# Patient Record
Sex: Female | Born: 1960 | Race: White | Hispanic: No | Marital: Married | Smoking: Never smoker
Health system: Southern US, Community
[De-identification: ages and names within clinical notes are randomized; demographics above are authoritative.]

## PROBLEM LIST (undated history)

## (undated) DIAGNOSIS — M1990 Unspecified osteoarthritis, unspecified site: Secondary | ICD-10-CM

## (undated) DIAGNOSIS — J45909 Unspecified asthma, uncomplicated: Secondary | ICD-10-CM

## (undated) DIAGNOSIS — I10 Essential (primary) hypertension: Secondary | ICD-10-CM

## (undated) HISTORY — DX: Unspecified asthma, uncomplicated: J45.909

## (undated) HISTORY — DX: Unspecified osteoarthritis, unspecified site: M19.90

## (undated) HISTORY — DX: Essential (primary) hypertension: I10

---

## 2015-05-06 ENCOUNTER — Telehealth: Payer: Self-pay

## 2015-05-06 NOTE — Telephone Encounter (Signed)
Sent montelukast 10mg  with 5 ref[facility]  [phone]

## 2015-05-09 ENCOUNTER — Other Ambulatory Visit: Payer: Self-pay | Admitting: *Deleted

## 2015-05-09 MED ORDER — MONTELUKAST SODIUM 10 MG PO TABS: 10.0000 mg | ORAL_TABLET | Freq: Every day | ORAL | Status: DC

## 2015-05-20 ENCOUNTER — Telehealth: Payer: Self-pay | Admitting: Internal Medicine

## 2015-05-20 ENCOUNTER — Other Ambulatory Visit: Payer: Self-pay | Admitting: Internal Medicine

## 2015-05-20 ENCOUNTER — Encounter: Payer: Self-pay | Admitting: Internal Medicine

## 2015-05-20 ENCOUNTER — Ambulatory Visit (INDEPENDENT_AMBULATORY_CARE_PROVIDER_SITE_OTHER): Payer: BLUE CROSS/BLUE SHIELD | Admitting: Internal Medicine

## 2015-05-20 VITALS — BP 124/78 | HR 88 | Temp 98.5°F | Resp 16 | Ht 63.58 in | Wt 192.7 lb

## 2015-05-20 DIAGNOSIS — J301 Allergic rhinitis due to pollen: Secondary | ICD-10-CM | POA: Insufficient documentation

## 2015-05-20 DIAGNOSIS — J4541 Moderate persistent asthma with (acute) exacerbation: Secondary | ICD-10-CM | POA: Diagnosis not present

## 2015-05-20 NOTE — Progress Notes (Signed)
05/20/2015  Bridget Bell [birth year] [id number]  Referring provider: No referring provider defined for this encounter.  Chief Complaint: Cough; Wheezing; Sore Throat; and Nasal Congestion   Bridget Bell is a 54 y.o. female who is being seen today for a sick visit.   HPI Comments: Asthma: For the past week patient has had a nonproductive cough requiring her to use her albuterol several times a day.  Allergic rhinitis on immunotherapy: Start 07/25/11, maintenance reached 01/23/2014. She is on biweekly injections. Symptoms were stable until recently as above. She has had a sore throat along with some drainage. No fever.    ROS: Per HPI unless specifically indicated below Review of Systems   Drug Allergies:  No Known Allergies  Medications:  Current outpatient prescriptions:  .  acetaminophen (TYLENOL) 650 MG CR tablet, Take 1,300 mg by mouth daily., Disp: , Rfl:  .  Albuterol Sulfate (PROVENTIL HFA IN), Inhale 2 puffs into the lungs every 4 (four) hours as needed., Disp: , Rfl:  .  amLODipine (NORVASC) 10 MG tablet, , Disp: , Rfl:  .  Bioflavonoid Products (ESTER C PO), Take 1,000 mg by mouth daily., Disp: , Rfl:  .  Calcium Carb-Cholecalciferol (CALCIUM+D3) 600-800 MG-UNIT TABS, Take by mouth daily., Disp: , Rfl:  .  Chromium-Cinnamon (CINNAMON PLUS CHROMIUM PO), Take 1,000 mg by mouth 2 (two) times daily., Disp: , Rfl:  .  cyclobenzaprine (FLEXERIL) 10 MG tablet, , Disp: , Rfl:  .  Digestive Enzymes (PAPAYA ENZYME PO), Take 66 mg by mouth daily., Disp: , Rfl:  .  Docosahexaenoic Acid (DHA PO), Take 400 mg by mouth daily., Disp: , Rfl:  .  ECHINACEA PO, Take 760 mg by mouth daily., Disp: , Rfl:  .  EPINEPHrine (EPIPEN 2-PAK) 0.3 mg/0.3 mL IJ SOAJ injection, Inject 0.3 mg into the muscle as needed., Disp: , Rfl:  .  estradiol (ESTRACE) 0.5 MG tablet, Take 0.5 mg by mouth daily. , Disp: , Rfl:  .  fexofenadine (ALLEGRA) 180 MG tablet, Take 180 mg by mouth daily., Disp: , Rfl:  .   Fexofenadine HCl (MUCINEX ALLERGY PO), Take 400 mg by mouth 2 (two) times daily. Sometimes patient takes it 3 times daily., Disp: , Rfl:  .  gabapentin (NEURONTIN) 300 MG capsule, , Disp: , Rfl:  .  GARCINIA CAMBOGIA-CHROMIUM PO, Take 3,000 mg by mouth daily., Disp: , Rfl:  .  Ginger, Zingiber officinalis, (GINGER ROOT) 550 MG CAPS, Take 550 mg by mouth daily., Disp: , Rfl:  .  hydrochlorothiazide (HYDRODIURIL) 25 MG tablet, , Disp: , Rfl:  .  KLOR-CON M10 10 MEQ tablet, Take by mouth., Disp: , Rfl:  .  KOREAN GINSENG PO, Take 500 mg by mouth daily., Disp: , Rfl:  .  Krill Oil (MAXIMUM RED KRILL PO), Take 1,000 mg by mouth daily., Disp: , Rfl:  .  losartan-hydrochlorothiazide (HYZAAR) 100-25 MG tablet, , Disp: , Rfl:  .  Magnesium 400 MG CAPS, Take by mouth daily., Disp: , Rfl:  .  meloxicam (MOBIC) 15 MG tablet, , Disp: , Rfl:  .  Misc Natural Products (GLUCOSAMINE CHONDROITIN MSM PO), Take 1,500 mg by mouth daily., Disp: , Rfl:  .  mometasone (NASONEX) 50 MCG/ACT nasal spray, , Disp: , Rfl:  .  montelukast (SINGULAIR) 10 MG tablet, Take 1 tablet (10 mg total) by mouth at bedtime., Disp: 30 tablet, Rfl: 5 .  Multiple Vitamins-Minerals (CENTRUM SILVER ULTRA WOMENS PO), Take by mouth daily., Disp: , Rfl:  .  NONFORMULARY  OR COMPOUNDED ITEM, , Disp: , Rfl:  .  omega-3 fish oil (MAXEPA) 1000 MG CAPS capsule, Take 2 capsules by mouth daily., Disp: , Rfl:  .  ranitidine (ZANTAC) 150 MG tablet, Take 150 mg by mouth daily., Disp: , Rfl:  .  SYMBICORT 160-4.5 MCG/ACT inhaler, Inhale 2 puffs into the lungs 2 (two) times daily. , Disp: , Rfl:  .  albuterol (PROVENTIL) (2.5 MG/3ML) 0.083% nebulizer solution, USE ONE VIAL IN NEBULIZER EVERY 4 TO 6 HOURS AS NEEDED FOR COUGH OR WHEEZE, Disp: 75 mL, Rfl: 1  Physical Exam: BP 124/78 mmHg  Pulse 88  Temp(Src) 98.5 F (36.9 C) (Oral)  Resp 16  Ht 5' 3.58" (1.615 m)  Wt 192 lb 10.9 oz (87.4 kg)  BMI 33.51 kg/m2  Physical Exam  Constitutional: She appears  well-developed and well-nourished. No distress.  HENT:  Right Ear: External ear normal.  Left Ear: External ear normal.  Nose: Nose normal.  Mouth/Throat: Oropharynx is clear and moist.  Eyes: Conjunctivae are normal. Right eye exhibits no discharge. Left eye exhibits no discharge.  Cardiovascular: Normal rate, regular rhythm and normal heart sounds.   No murmur heard. Pulmonary/Chest: Effort normal and breath sounds normal. No respiratory distress. She has no wheezes. She has no rales.  Abdominal: Soft. Bowel sounds are normal.  Musculoskeletal: She exhibits no edema.  Lymphadenopathy:    She has no cervical adenopathy.  Neurological: She is alert.  Skin: No rash noted.  Vitals reviewed.   Diagnostics:   Spirometry: FEV1 101 %, FEV1/FVC  77 %   Spirometry is in the normal range.  Assessment and Plan:  Asthma  Persistent, currently not well controlled due to viral syndrome versus allergies. Prednisone 10 mg tablets. Take 1 tablet twice a day for 4 days, then 1 tablet on day #5. Continue Symbicort, Singulair, as needed albuterol. Hold flu shot and allergy shots until back to baseline.    Return in about 4 weeks (around 06/17/2015).  Thank you for the opportunity to care for this patient.  Please do not hesitate to contact me with questions.  [facility] [place] [address] [place], [place] [id number] [phone]

## 2015-05-20 NOTE — Telephone Encounter (Signed)
Faxed in prescription for albuterol nebulizer. Informed patient.

## 2015-05-20 NOTE — Assessment & Plan Note (Signed)
   Persistent, currently not well controlled due to viral syndrome versus allergies. Prednisone 10 mg tablets. Take 1 tablet twice a day for 4 days, then 1 tablet on day #5. Continue Symbicort, Singulair, as needed albuterol. Hold flu shot and allergy shots until back to baseline.

## 2015-05-20 NOTE — Patient Instructions (Signed)
Asthma  Persistent, currently not well controlled due to viral syndrome versus allergies. Prednisone 10 mg tablets. Take 1 tablet twice a day for 4 days, then 1 tablet on day #5. Continue Symbicort, Singulair, as needed albuterol. Hold flu shot and allergy shots until back to baseline.

## 2015-05-20 NOTE — Telephone Encounter (Signed)
She forgot that she needed her Albuterol nebulizer refilled. [facility] uses [facility] [place]

## 2015-05-24 ENCOUNTER — Ambulatory Visit: Payer: BLUE CROSS/BLUE SHIELD | Admitting: Pediatrics

## 2015-06-22 ENCOUNTER — Ambulatory Visit: Payer: BLUE CROSS/BLUE SHIELD | Admitting: Internal Medicine

## 2015-07-08 DIAGNOSIS — J301 Allergic rhinitis due to pollen: Secondary | ICD-10-CM | POA: Diagnosis not present

## 2015-07-12 ENCOUNTER — Ambulatory Visit: Payer: BLUE CROSS/BLUE SHIELD | Admitting: *Deleted

## 2015-07-12 ENCOUNTER — Ambulatory Visit (INDEPENDENT_AMBULATORY_CARE_PROVIDER_SITE_OTHER): Payer: BLUE CROSS/BLUE SHIELD | Admitting: *Deleted

## 2015-07-12 DIAGNOSIS — J309 Allergic rhinitis, unspecified: Secondary | ICD-10-CM | POA: Diagnosis not present

## 2015-07-12 NOTE — Progress Notes (Signed)
Immunotherapy   Patient Details  Name: Bridget Bell MRN: [id number] Date of Birth: Jan 28, 1961  07/12/2015  Bridget Bell here to pick up  Red vial 1:100 Grass-Tree Following schedule: C  Frequency: Build up weekly, at [facility]0.5cc every 2 weeks. Epi-Pen:Prescription for Epi-Pen given  Consent signed and patient instructions given. 0.1cc given today.    Chales SalmonLogan Douglas 07/12/2015, 3:56 PM

## 2015-08-01 ENCOUNTER — Ambulatory Visit: Payer: Self-pay | Admitting: Internal Medicine

## 2015-08-23 ENCOUNTER — Encounter: Payer: Self-pay | Admitting: Pediatrics

## 2015-08-23 ENCOUNTER — Ambulatory Visit (INDEPENDENT_AMBULATORY_CARE_PROVIDER_SITE_OTHER): Payer: BLUE CROSS/BLUE SHIELD | Admitting: Pediatrics

## 2015-08-23 VITALS — BP 120/80 | HR 100 | Temp 98.0°F | Resp 16

## 2015-08-23 DIAGNOSIS — J4551 Severe persistent asthma with (acute) exacerbation: Secondary | ICD-10-CM | POA: Insufficient documentation

## 2015-08-23 DIAGNOSIS — K219 Gastro-esophageal reflux disease without esophagitis: Secondary | ICD-10-CM | POA: Diagnosis not present

## 2015-08-23 DIAGNOSIS — J301 Allergic rhinitis due to pollen: Secondary | ICD-10-CM | POA: Diagnosis not present

## 2015-08-23 DIAGNOSIS — J4541 Moderate persistent asthma with (acute) exacerbation: Secondary | ICD-10-CM

## 2015-08-23 MED ORDER — SYMBICORT 160-4.5 MCG/ACT IN AERO: 2.0000 | INHALATION_SPRAY | Freq: Two times a day (BID) | RESPIRATORY_TRACT | Status: DC

## 2015-08-23 MED ORDER — ALBUTEROL SULFATE HFA 108 (90 BASE) MCG/ACT IN AERS: 2.0000 | INHALATION_SPRAY | RESPIRATORY_TRACT | Status: DC | PRN

## 2015-08-23 NOTE — Progress Notes (Signed)
  [address] [place] [address] Dept: [phone]  FOLLOW UP NOTE  Patient ID: Bridget Bell, female    DOB: [birth year]  Age: 55 y.o. MRN: [id number] Date of Office Visit: 08/23/2015  Assessment Chief Complaint: Cough  HPI Bridget Bell presents for evaluation of coughing and wheezing. She had a cold 2 weeks ago and treated it  with nasal saline irrigations followed by Nasonex. She has developed severe coughing spells over the past 3 days. She is on allergy injections every 2 weeks to grass and tree pollens  Current medications are Symbicort 160-4.5 to take 2 puffs twice a day, albuterol 0.083% one unit dose every 4 hours if needed, Proventil 2 puffs every 4 hours if needed, montelukast 10 mg once a day, fexofenadine 180 mg once a day if needed, and nasal saline irrigations followed by Nasonex 1 spray per nostril twice a day, allergy injections every 2 weeks. Her other medications are outlined in the chart..   Drug Allergies:  No Known Allergies  Physical Exam: BP 120/80 mmHg  Pulse 100  Temp(Src) 98 F (36.7 C) (Oral)  Resp 16   Physical Exam  Constitutional: She is oriented to person, place, and time. She appears well-developed and well-nourished.  HENT:  Eyes normal. Ears normal. Nose mild swelling of his turbinates. Pharynx normal.  Neck: Neck supple.  Cardiovascular:  S1 and S2 normal no murmurs  Pulmonary/Chest:  Clear to percussion and auscultation  Lymphadenopathy:    She has no cervical adenopathy.  Neurological: She is alert and oriented to person, place, and time.  Psychiatric: She has a normal mood and affect. Her behavior is normal. Judgment and thought content normal.  Vitals reviewed.   Diagnostics:  FVC 3.35 L FEV1 2.78 L. Predicted FVC 3.40 L predicted FEV1  2.65 liters-spirometry in the normal range  Assessment and Plan: 1. Moderate persistent asthma, with acute exacerbation   2. Allergic rhinitis due to pollen   3. Gastroesophageal reflux  disease without esophagitis     Meds ordered this encounter  Medications  . SYMBICORT 160-4.5 MCG/ACT inhaler    Sig: Inhale 2 puffs into the lungs 2 (two) times daily.    Dispense:  1 Inhaler    Refill:  5  . albuterol (PROVENTIL HFA) 108 (90 Base) MCG/ACT inhaler    Sig: Inhale 2 puffs into the lungs every 4 (four) hours as needed.    Dispense:  1 Inhaler    Refill:  1    Patient Instructions  Continue on your current medications Add prednisone 20 mg twice a day for 3 days, 20 mg on day 4, 10 mg on day  5 Call me if you're not doing well on this treatment plan    Return in about 6 months (around 02/20/2016).    Thank you for the opportunity to care for this patient.  Please do not hesitate to contact me with questions.  Tonette Bihari, M.D.  [facility] [address] [place], [place] [address] [phone]

## 2015-08-23 NOTE — Patient Instructions (Addendum)
Continue on your current medications Add prednisone 20 mg twice a day for 3 days, 20 mg on day 4, 10 mg on day  5 Call me if you're not doing well on this treatment plan

## 2015-09-02 ENCOUNTER — Encounter: Payer: Self-pay | Admitting: Internal Medicine

## 2015-09-02 ENCOUNTER — Ambulatory Visit (INDEPENDENT_AMBULATORY_CARE_PROVIDER_SITE_OTHER): Payer: BLUE CROSS/BLUE SHIELD | Admitting: Internal Medicine

## 2015-09-02 VITALS — BP 134/86 | HR 96 | Temp 98.8°F | Resp 16

## 2015-09-02 DIAGNOSIS — J301 Allergic rhinitis due to pollen: Secondary | ICD-10-CM

## 2015-09-02 DIAGNOSIS — J4541 Moderate persistent asthma with (acute) exacerbation: Secondary | ICD-10-CM

## 2015-09-02 LAB — PULMONARY FUNCTION TEST

## 2015-09-02 MED ORDER — ALBUTEROL SULFATE (2.5 MG/3ML) 0.083% IN NEBU: 2.5000 mg | INHALATION_SOLUTION | RESPIRATORY_TRACT | Status: DC | PRN

## 2015-09-02 MED ORDER — AZITHROMYCIN 250 MG PO TABS: ORAL_TABLET | ORAL | Status: DC

## 2015-09-02 MED ORDER — ALBUTEROL SULFATE (2.5 MG/3ML) 0.083% IN NEBU: 2.5000 mg | INHALATION_SOLUTION | Freq: Four times a day (QID) | RESPIRATORY_TRACT | Status: DC | PRN

## 2015-09-02 NOTE — Patient Instructions (Signed)
Allergic rhinitis due to pollen  On immunotherapy, currently not well controlled due to sinusitis  Continue Singulair 10 mg daily, Allegra 180 mg daily (fexofenadine), Mucinex, Nasonex 2 sprays each nostril daily area   Perform nasal saline lavage prior to nasal sprays  Given a Z-Pak  Has EpiPen and action plan  Moderate persistent asthma  Currently not well controlled due to infection  Antibiotics as above  Continue Symbicort 160 g 2 puffs twice a day, Singulair 10 mg daily, as needed albuterol  Will evaluate for Nucala versus Xolair  Check immune screen. Will check CBC with differential, CMP, ESR, quantitative antibody levels, tetanus/diphtheria/pneumococcal titers, CH 50, zone 3 allergen panel. She has never had a Pneumovax.

## 2015-09-02 NOTE — Assessment & Plan Note (Signed)
   Currently not well controlled due to infection  Antibiotics as above  Continue Symbicort 160 g 2 puffs twice a day, Singulair 10 mg daily, as needed albuterol  Will evaluate for Nucala versus Xolair  Check immune screen. Will check CBC with differential, CMP, ESR, quantitative antibody levels, tetanus/diphtheria/pneumococcal titers, CH 50, zone 3 allergen panel. She has never had a Pneumovax.

## 2015-09-02 NOTE — Assessment & Plan Note (Signed)
   On immunotherapy, currently not well controlled due to sinusitis  Continue Singulair 10 mg daily, Allegra 180 mg daily (fexofenadine), Mucinex, Nasonex 2 sprays each nostril daily area   Perform nasal saline lavage prior to nasal sprays  Given a Z-Pak  Has EpiPen and action plan

## 2015-09-02 NOTE — Progress Notes (Signed)
History of Present Illness: Bridget Bell is a 55 y.o. female presenting for a sick visit  HPI Comments: Asthma: At patient's last visit, symptoms were well controlled so she was given a prednisone burst and she had transient improvement in her symptoms. For the past 3 weeks, she has had productive cough, nasal drainage of green mucus, associated coughing and wheezing. She has increased use of her rescue inhaler but has not had improvement in her symptoms.  Allergic rhinitis on immunotherapy for grass and tree pollen: Start 07/25/11, maintenance reached 01/23/2014. She is on biweekly injections. Symptoms were stable until recently as above.    Assessment and Plan: Allergic rhinitis due to pollen  On immunotherapy, currently not well controlled due to sinusitis  Continue Singulair 10 mg daily, Allegra 180 mg daily (fexofenadine), Mucinex, Nasonex 2 sprays each nostril daily area   Perform nasal saline lavage prior to nasal sprays  Given a Z-Pak  Has EpiPen and action plan  Moderate persistent asthma  Currently not well controlled due to infection  Antibiotics as above  Continue Symbicort 160 g 2 puffs twice a day, Singulair 10 mg daily, as needed albuterol  Will evaluate for Nucala versus Xolair  Check immune screen. Will check CBC with differential, CMP, ESR, quantitative antibody levels, tetanus/diphtheria/pneumococcal titers, CH 50, zone 3 allergen panel. She has never had a Pneumovax.    Return in about 3 months (around 11/30/2015).  Medications ordered this encounter:  Meds ordered this encounter  Medications  . azithromycin (ZITHROMAX) 250 MG tablet    Sig: Take 2 tablets by mouth today then 1 tablet day 2 thru 5    Dispense:  6 each    Refill:  0    For in  . albuterol (PROVENTIL) (2.5 MG/3ML) 0.083% nebulizer solution    Sig: Take 3 mLs (2.5 mg total) by nebulization every 6 (six) hours as needed for wheezing or shortness of breath.    Dispense:  75 mL    Refill:   12  . azithromycin (ZITHROMAX) 250 MG tablet    Sig: Take 2 tabs by mouth today, then take 1 tab day 2 thru 5 for infection    Dispense:  6 each    Refill:  0  . albuterol (PROVENTIL) (2.5 MG/3ML) 0.083% nebulizer solution    Sig: Take 3 mLs (2.5 mg total) by nebulization every 4 (four) hours as needed for wheezing or shortness of breath.    Dispense:  75 mL    Refill:  1    Diagnostics: Spirometry: FEV1 2.89L or 109%, FEV1/FVC  87%.  This is a normal study  Physical Exam: BP 134/86 mmHg  Pulse 96  Temp(Src) 98.8 F (37.1 C) (Oral)  Resp 16   Physical Exam  Constitutional: She appears well-developed and well-nourished. No distress.  HENT:  Right Ear: External ear normal.  Left Ear: External ear normal.  Nose: Nose normal.  Mouth/Throat: Oropharynx is clear and moist.  Eyes: Conjunctivae are normal. Right eye exhibits no discharge. Left eye exhibits no discharge.  Cardiovascular: Normal rate, regular rhythm and normal heart sounds.   No murmur heard. Pulmonary/Chest: Effort normal and breath sounds normal. No respiratory distress. She has no wheezes. She has no rales.  Abdominal: Soft. Bowel sounds are normal.  Musculoskeletal: She exhibits no edema.  Lymphadenopathy:    She has no cervical adenopathy.  Neurological: She is alert.  Skin: No rash noted.  Vitals reviewed.   Medications: Current outpatient prescriptions:  .  acetaminophen (TYLENOL)  650 MG CR tablet, Take 1,300 mg by mouth daily., Disp: , Rfl:  .  albuterol (PROVENTIL HFA) 108 (90 Base) MCG/ACT inhaler, Inhale 2 puffs into the lungs every 4 (four) hours as needed., Disp: 1 Inhaler, Rfl: 1 .  albuterol (PROVENTIL) (2.5 MG/3ML) 0.083% nebulizer solution, USE ONE VIAL IN NEBULIZER EVERY 4 TO 6 HOURS AS NEEDED FOR COUGH OR WHEEZE, Disp: 75 mL, Rfl: 1 .  amLODipine (NORVASC) 10 MG tablet, , Disp: , Rfl:  .  Bioflavonoid Products (ESTER C PO), Take 1,000 mg by mouth daily., Disp: , Rfl:  .  Calcium  Carb-Cholecalciferol (CALCIUM+D3) 600-800 MG-UNIT TABS, Take by mouth daily., Disp: , Rfl:  .  Chromium-Cinnamon (CINNAMON PLUS CHROMIUM PO), Take 1,000 mg by mouth 2 (two) times daily., Disp: , Rfl:  .  cyclobenzaprine (FLEXERIL) 10 MG tablet, , Disp: , Rfl:  .  Digestive Enzymes (PAPAYA ENZYME PO), Take 66 mg by mouth daily., Disp: , Rfl:  .  Docosahexaenoic Acid (DHA PO), Take 400 mg by mouth daily., Disp: , Rfl:  .  ECHINACEA PO, Take 760 mg by mouth daily., Disp: , Rfl:  .  EPINEPHrine (EPIPEN 2-PAK) 0.3 mg/0.3 mL IJ SOAJ injection, Inject 0.3 mg into the muscle as needed., Disp: , Rfl:  .  estradiol (ESTRACE) 0.5 MG tablet, Take 0.5 mg by mouth daily. , Disp: , Rfl:  .  fexofenadine (ALLEGRA) 180 MG tablet, Take 180 mg by mouth daily., Disp: , Rfl:  .  Fexofenadine HCl (MUCINEX ALLERGY PO), Take 400 mg by mouth 2 (two) times daily. Sometimes patient takes it 3 times daily., Disp: , Rfl:  .  gabapentin (NEURONTIN) 300 MG capsule, , Disp: , Rfl:  .  Ginger, Zingiber officinalis, (GINGER ROOT) 550 MG CAPS, Take 550 mg by mouth daily., Disp: , Rfl:  .  hydrochlorothiazide (HYDRODIURIL) 25 MG tablet, , Disp: , Rfl:  .  KLOR-CON M10 10 MEQ tablet, Take by mouth., Disp: , Rfl:  .  KOREAN GINSENG PO, Take 500 mg by mouth daily. Reported on 09/02/2015, Disp: , Rfl:  .  Krill Oil (MAXIMUM RED KRILL PO), Take 1,000 mg by mouth daily., Disp: , Rfl:  .  losartan-hydrochlorothiazide (HYZAAR) 100-25 MG tablet, , Disp: , Rfl:  .  Magnesium 400 MG CAPS, Take by mouth daily., Disp: , Rfl:  .  meloxicam (MOBIC) 15 MG tablet, , Disp: , Rfl:  .  Misc Natural Products (GLUCOSAMINE CHONDROITIN MSM PO), Take 1,500 mg by mouth daily., Disp: , Rfl:  .  mometasone (NASONEX) 50 MCG/ACT nasal spray, , Disp: , Rfl:  .  montelukast (SINGULAIR) 10 MG tablet, Take 1 tablet (10 mg total) by mouth at bedtime., Disp: 30 tablet, Rfl: 5 .  Multiple Vitamins-Minerals (CENTRUM SILVER ULTRA WOMENS PO), Take by mouth daily., Disp:  , Rfl:  .  NONFORMULARY OR COMPOUNDED ITEM, , Disp: , Rfl:  .  omega-3 fish oil (MAXEPA) 1000 MG CAPS capsule, Take 2 capsules by mouth daily., Disp: , Rfl:  .  ranitidine (ZANTAC) 150 MG tablet, Take 150 mg by mouth daily., Disp: , Rfl:  .  SYMBICORT 160-4.5 MCG/ACT inhaler, Inhale 2 puffs into the lungs 2 (two) times daily., Disp: 1 Inhaler, Rfl: 5 .  albuterol (PROVENTIL) (2.5 MG/3ML) 0.083% nebulizer solution, Take 3 mLs (2.5 mg total) by nebulization every 6 (six) hours as needed for wheezing or shortness of breath., Disp: 75 mL, Rfl: 12 .  albuterol (PROVENTIL) (2.5 MG/3ML) 0.083% nebulizer solution, Take 3 mLs (  2.5 mg total) by nebulization every 4 (four) hours as needed for wheezing or shortness of breath., Disp: 75 mL, Rfl: 1 .  azithromycin (ZITHROMAX) 250 MG tablet, Take 2 tablets by mouth today then 1 tablet day 2 thru 5, Disp: 6 each, Rfl: 0 .  azithromycin (ZITHROMAX) 250 MG tablet, Take 2 tabs by mouth today, then take 1 tab day 2 thru 5 for infection, Disp: 6 each, Rfl: 0 .  GARCINIA CAMBOGIA-CHROMIUM PO, Take 3,000 mg by mouth daily. Reported on 09/02/2015, Disp: , Rfl:   Drug Allergies:  No Known Allergies  ROS: Per HPI unless specifically indicated below Review of Systems  Thank you for the opportunity to care for this patient.  Please do not hesitate to contact me with questions.

## 2015-09-05 ENCOUNTER — Encounter: Payer: Self-pay | Admitting: Internal Medicine

## 2015-09-07 LAB — CBC WITH DIFFERENTIAL/PLATELET

## 2015-09-08 ENCOUNTER — Encounter: Payer: Self-pay | Admitting: *Deleted

## 2015-09-08 LAB — CBC WITH DIFFERENTIAL/PLATELET

## 2015-09-12 ENCOUNTER — Telehealth: Payer: Self-pay | Admitting: *Deleted

## 2015-09-12 NOTE — Telephone Encounter (Signed)
Called patient and explained Xolair submission, process, copay and mailed information to patient

## 2015-09-12 NOTE — Telephone Encounter (Signed)
-----   Message from Clifton James, CMA sent at 09/12/2015  2:38 PM EST ----- Regarding: Phillis Haggis,   Please call patient with Xolair information and please submit.   Thank you

## 2015-09-14 LAB — COMPREHENSIVE METABOLIC PANEL
ALT: 24 IU/L (ref 0–32)
AST: 18 IU/L (ref 0–40)
Albumin/Globulin Ratio: 2 (ref 1.1–2.5)
Albumin: 4.3 g/dL (ref 3.5–5.5)
Alkaline Phosphatase: 65 IU/L (ref 39–117)
BUN/Creatinine Ratio: 17 (ref 9–23)
BUN: 15 mg/dL (ref 6–24)
Bilirubin Total: <0.2 mg/dL (ref 0.0–1.2)
CALCIUM: 9.6 mg/dL (ref 8.7–10.2)
CO2: 20 mmol/L (ref 18–29)
Chloride: 99 mmol/L (ref 96–106)
Creatinine, Ser: 0.89 mg/dL (ref 0.57–1.00)
GFR, EST AFRICAN AMERICAN: 83 mL/min/{1.73_m2} (ref >59)
GFR, EST NON AFRICAN AMERICAN: 72 mL/min/{1.73_m2} (ref >59)
GLUCOSE: 114 mg/dL — AB (ref 65–99)
Globulin, Total: 2.2 g/dL (ref 1.5–4.5)
Potassium: 3.8 mmol/L (ref 3.5–5.2)
Sodium: 141 mmol/L (ref 134–144)
TOTAL PROTEIN: 6.5 g/dL (ref 6.0–8.5)

## 2015-09-14 LAB — COMPLEMENT, TOTAL: Compl, Total (CH50): >60 U/mL — ABNORMAL HIGH (ref 42–60)

## 2015-09-14 LAB — ALLERGENS, ZONE 3
Alternaria Alternata IgE: <0.1 kU/L
Aspergillus Fumigatus IgE: <0.1 kU/L
Cat Dander IgE: 25.3 kU/L — AB
Cladosporium Herbarum IgE: <0.1 kU/L
D001-IGE D PTERONYSSINUS: 0.2 kU/L — AB
D002-IGE D FARINAE: 0.14 kU/L — AB
Dog Dander IgE: 3.97 kU/L — AB
Elm, American IgE: <0.1 kU/L
Hickory, White IgE: <0.1 kU/L
Johnson Grass IgE: 0.17 kU/L — AB
Kentucky Bluegrass IgE: <0.1 kU/L
Penicillium Chrysogen IgE: <0.1 kU/L
Plantain, English IgE: <0.1 kU/L
White Mulberry IgE: <0.1 kU/L

## 2015-09-14 LAB — DIPHTHERIA ANTITOXOID AB: DIPHTHERIA AB: 1.72 [IU]/mL (ref <0.10)

## 2015-09-14 LAB — IGG, IGA, IGM
IGA/IMMUNOGLOBULIN A, SERUM: 268 mg/dL (ref 87–352)
IGG (IMMUNOGLOBIN G), SERUM: 809 mg/dL (ref 700–1600)
IGM (IMMUNOGLOBULIN M), SRM: 116 mg/dL (ref 26–217)

## 2015-09-14 LAB — PNEUMOCOCCAL IM (14 SEROTYPE)
PNEUMO AB TYPE 3: 1.4 ug/mL (ref >1.3)
PNEUMO AB TYPE 4: 0.5 ug/mL — AB (ref >1.3)
PNEUMO AB TYPE 68 (9V): 1.4 ug/mL (ref >1.3)
PNEUMO AB TYPE 9 (9N): 1.3 ug/mL — AB (ref >1.3)
Pneumo Ab Type 1*: 0.5 ug/mL — ABNORMAL LOW (ref >1.3)
Pneumo Ab Type 14*: 7.9 ug/mL (ref >1.3)
Pneumo Ab Type 19 (19F)*: 1 ug/mL — ABNORMAL LOW (ref >1.3)
Pneumo Ab Type 23 (23F)*: 1.2 ug/mL — ABNORMAL LOW (ref >1.3)
Pneumo Ab Type 26 (6B)*: 1 ug/mL — ABNORMAL LOW (ref >1.3)
Pneumo Ab Type 56 (18C)*: 0.5 ug/mL — ABNORMAL LOW (ref >1.3)
Pneumo Ab Type 57 (19A)*: 4.6 ug/mL (ref >1.3)

## 2015-09-14 LAB — IGE: IgE (Immunoglobulin E), Serum: 522 IU/mL — ABNORMAL HIGH (ref 0–100)

## 2015-09-14 LAB — TETANUS ANTIBODY, IGG: Tetanus Ab, IgG: 3.27 IU/mL (ref <0.10)

## 2015-09-14 LAB — SEDIMENTATION RATE: Sed Rate: 5 mm/hr (ref 0–40)

## 2015-10-05 ENCOUNTER — Telehealth: Payer: Self-pay | Admitting: Allergy

## 2015-10-05 NOTE — Telephone Encounter (Signed)
NA

## 2015-10-14 ENCOUNTER — Other Ambulatory Visit: Payer: Self-pay | Admitting: *Deleted

## 2015-10-14 DIAGNOSIS — J4540 Moderate persistent asthma, uncomplicated: Secondary | ICD-10-CM

## 2015-10-14 MED ORDER — OMALIZUMAB 150 MG ~~LOC~~ SOLR
225.0000 mg | SUBCUTANEOUS | Status: DC
  Administered 2015-11-14 – 2016-03-05 (×8): 225 mg via SUBCUTANEOUS

## 2015-11-10 ENCOUNTER — Other Ambulatory Visit: Payer: Self-pay | Admitting: Pediatrics

## 2015-11-14 ENCOUNTER — Ambulatory Visit (INDEPENDENT_AMBULATORY_CARE_PROVIDER_SITE_OTHER): Payer: BLUE CROSS/BLUE SHIELD | Admitting: *Deleted

## 2015-11-14 DIAGNOSIS — J4540 Moderate persistent asthma, uncomplicated: Secondary | ICD-10-CM | POA: Diagnosis not present

## 2015-11-14 NOTE — Progress Notes (Signed)
Immunotherapy   Patient Details  Name: Jacqulyn BathRonda Mcguffee MRN: [id number] Date of Birth: Apr 04, 1961  11/14/2015  Jacqulyn Bathonda Stoneman started injections for  xolair 225mg   Following schedule:  Frequency: every 14 days Epi-Pen:Prescription for Epi-Pen given Consent signed and patient instructions given.   Chales SalmonLogan Douglas 11/14/2015, 4:18 PM

## 2015-11-15 ENCOUNTER — Other Ambulatory Visit: Payer: Self-pay | Admitting: *Deleted

## 2015-11-15 MED ORDER — EPINEPHRINE 0.3 MG/0.3ML IJ SOAJ: 0.3000 mg | INTRAMUSCULAR | Status: DC | PRN

## 2015-11-28 ENCOUNTER — Ambulatory Visit (INDEPENDENT_AMBULATORY_CARE_PROVIDER_SITE_OTHER): Payer: BLUE CROSS/BLUE SHIELD

## 2015-11-28 DIAGNOSIS — J4540 Moderate persistent asthma, uncomplicated: Secondary | ICD-10-CM

## 2015-12-08 DIAGNOSIS — J301 Allergic rhinitis due to pollen: Secondary | ICD-10-CM | POA: Diagnosis not present

## 2015-12-12 ENCOUNTER — Ambulatory Visit (INDEPENDENT_AMBULATORY_CARE_PROVIDER_SITE_OTHER): Payer: BLUE CROSS/BLUE SHIELD

## 2015-12-12 DIAGNOSIS — J4540 Moderate persistent asthma, uncomplicated: Secondary | ICD-10-CM

## 2015-12-13 ENCOUNTER — Ambulatory Visit: Payer: BLUE CROSS/BLUE SHIELD

## 2015-12-13 ENCOUNTER — Ambulatory Visit (INDEPENDENT_AMBULATORY_CARE_PROVIDER_SITE_OTHER): Payer: BLUE CROSS/BLUE SHIELD

## 2015-12-13 DIAGNOSIS — J309 Allergic rhinitis, unspecified: Secondary | ICD-10-CM | POA: Diagnosis not present

## 2015-12-13 NOTE — Progress Notes (Signed)
Immunotherapy   Patient Details  Name: Jacqulyn BathRonda Bernick MRN: [id number] Date of Birth: 1960/12/17  12/13/2015  Jacqulyn Bathonda Towle here to pick up RED 1/100 GRASS-TREE Following schedule: C  At 0.50 every 2 weeks Frequency:1 time per week Epi-Pen:Epi-Pen Available  Consent signed and patient instructions given. No problems.   Jacqulyn CaneJanet Nolberto Cheuvront 12/13/2015, 4:06 PM

## 2016-01-02 ENCOUNTER — Ambulatory Visit (INDEPENDENT_AMBULATORY_CARE_PROVIDER_SITE_OTHER): Payer: BLUE CROSS/BLUE SHIELD

## 2016-01-02 DIAGNOSIS — J4540 Moderate persistent asthma, uncomplicated: Secondary | ICD-10-CM

## 2016-01-16 ENCOUNTER — Ambulatory Visit (INDEPENDENT_AMBULATORY_CARE_PROVIDER_SITE_OTHER): Payer: BLUE CROSS/BLUE SHIELD

## 2016-01-16 DIAGNOSIS — J4540 Moderate persistent asthma, uncomplicated: Secondary | ICD-10-CM | POA: Diagnosis not present

## 2016-01-30 ENCOUNTER — Ambulatory Visit (INDEPENDENT_AMBULATORY_CARE_PROVIDER_SITE_OTHER): Payer: BLUE CROSS/BLUE SHIELD

## 2016-01-30 DIAGNOSIS — J4540 Moderate persistent asthma, uncomplicated: Secondary | ICD-10-CM

## 2016-02-09 ENCOUNTER — Other Ambulatory Visit: Payer: Self-pay | Admitting: Allergy

## 2016-02-09 MED ORDER — MOMETASONE FUROATE 50 MCG/ACT NA SUSP: 2.0000 | Freq: Every day | NASAL | Status: DC

## 2016-02-13 ENCOUNTER — Ambulatory Visit (INDEPENDENT_AMBULATORY_CARE_PROVIDER_SITE_OTHER): Payer: BLUE CROSS/BLUE SHIELD

## 2016-02-13 DIAGNOSIS — J4540 Moderate persistent asthma, uncomplicated: Secondary | ICD-10-CM

## 2016-02-16 ENCOUNTER — Other Ambulatory Visit: Payer: Self-pay

## 2016-02-23 ENCOUNTER — Telehealth: Payer: Self-pay | Admitting: *Deleted

## 2016-02-23 NOTE — Telephone Encounter (Signed)
Called and left message for patient needs office visit per insurance before they can renew her authorization for Xolair which expires Aug. 20.

## 2016-02-29 ENCOUNTER — Encounter: Payer: Self-pay | Admitting: Allergy and Immunology

## 2016-02-29 ENCOUNTER — Ambulatory Visit (INDEPENDENT_AMBULATORY_CARE_PROVIDER_SITE_OTHER): Payer: BLUE CROSS/BLUE SHIELD | Admitting: Allergy and Immunology

## 2016-02-29 VITALS — BP 124/82 | HR 100 | Temp 99.1°F | Resp 20

## 2016-02-29 DIAGNOSIS — J4540 Moderate persistent asthma, uncomplicated: Secondary | ICD-10-CM | POA: Diagnosis not present

## 2016-02-29 DIAGNOSIS — R040 Epistaxis: Secondary | ICD-10-CM | POA: Insufficient documentation

## 2016-02-29 DIAGNOSIS — J301 Allergic rhinitis due to pollen: Secondary | ICD-10-CM

## 2016-02-29 MED ORDER — OPTICHAMBER DIAMOND MISC: 1 refills | Status: DC

## 2016-02-29 NOTE — Assessment & Plan Note (Signed)
   Nasal saline gel is recommended to moisturize nasal mucosa.  During epistaxis, oxymetazoline may be used to help stanch blood flow if needed.  If this problem persists or progresses, otolaryngology evaluation may be warranted.

## 2016-02-29 NOTE — Assessment & Plan Note (Addendum)
Improved and well controlled on omalizumab therapy.  Continue omalizumab injections every 2 weeks.  Now, continue Symbicort 160/4.5 g, 2 inhalations twice a day.  To maximize pulmonary deposition, a spacer has been provided along with instructions for its proper administration with an HFA inhaler.  Continue montelukast 10 mg daily bedtime and albuterol HFA, 1-2 inhalations every 4-6 hours as needed.  If Bridget Bell's subjective and objective measures of pulmonary function remain stable, we will consider stepping down therapy on the next visit.

## 2016-02-29 NOTE — Progress Notes (Signed)
Follow-up Note  RE: Bridget Bell MRN: [id number] DOB: 1961/06/22 Date of Office Visit: 02/29/2016  Primary care provider: Molinda Bailiff, PA Referring provider: Molinda Bailiff, PA  History of present illness: Bridget Bell is a 55 y.o. female with persistent asthma and allergic rhinitis presenting today for follow up.  She was last seen in this clinic on 09/02/2015.  She has received 7 rounds of omalizumab injections since April 24.  Her asthma has improved significantly since that time.  She has not required asthma rescue medication, experienced nocturnal awakenings due to lower respiratory symptoms, nor have activities of daily living been limited.  He has not had any problems or complications with omalizumab injections.  Prior to starting omalizumab, she had frequent asthma exacerbations requiring systemic steroids.  She currently takes Symbicort 160/4.5 g, 2 inhalations twice a day.  She only does not use a spacer device with HFA inhalers.  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  She reports that she experiences epistaxes approximately every 3 or 4 weeks.  The blood flow is from the right side and typically takes 1 minute to stanch.  She has no other nasal symptom complaints today.    Assessment and plan: Moderate persistent asthma Improved and well controlled on omalizumab therapy.  Continue omalizumab injections every 2 weeks.  Now, continue Symbicort 160/4.5 g, 2 inhalations twice a day.  To maximize pulmonary deposition, a spacer has been provided along with instructions for its proper administration with an HFA inhaler.  Continue montelukast 10 mg daily bedtime and albuterol HFA, 1-2 inhalations every 4-6 hours as needed.  If Denine's subjective and objective measures of pulmonary  function remain stable, we will consider stepping down therapy on the next visit.  Epistaxis  Nasal saline gel is recommended to moisturize nasal mucosa.  During epistaxis, oxymetazoline may be used to help stanch blood flow if needed.  If this problem persists or progresses, otolaryngology evaluation may be warranted.   Allergic rhinitis due to pollen  Continue appropriate allergen avoidance measures, immunotherapy, montelukast daily, and fexofenadine as needed.  Mometasone nasal spray will be discontinued at this time due to epistaxis.  Diagnositics: Spirometry reveals an FVC of 3.59 L and an FEV1 of 2.85 L (121% predicted).  Normal ventilatory function.  Please see scanned spirometry results for details.    Physical examination: Blood pressure 124/82, pulse 100, temperature 99.1 F (37.3 C), temperature source Oral, resp. rate 20, SpO2 92 %.  General: Alert, interactive, in no acute distress. HEENT: TMs pearly gray, turbinates minimally edematous without discharge, post-pharynx mildly erythematous. Neck: Supple without lymphadenopathy. Lungs: Clear to auscultation without wheezing, rhonchi or rales. CV: Normal S1, S2 without murmurs. Skin: Warm and dry, without lesions or rashes.  The following portions of the patient's history were reviewed and updated as appropriate: allergies, current medications, past family history, past medical history, past social history, past surgical history and problem list.    Medication List       Accurate as of 02/29/16 10:00 PM. Always use your most recent med list.          acetaminophen 650 MG CR tablet Commonly known as:  TYLENOL Take 1,300 mg by mouth daily.   albuterol (2.5 MG/3ML) 0.083% nebulizer solution Commonly known as:  PROVENTIL USE ONE VIAL IN NEBULIZER EVERY 4  TO 6 HOURS AS NEEDED FOR COUGH OR WHEEZE   albuterol 108 (90 Base) MCG/ACT inhaler Commonly known as:  PROVENTIL HFA Inhale 2 puffs into the lungs every 4  (four) hours as needed.   amLODipine 10 MG tablet Commonly known as:  NORVASC   CALCIUM+D3 600-800 MG-UNIT Tabs Generic drug:  Calcium Carb-Cholecalciferol Take by mouth daily.   CENTRUM SILVER ULTRA WOMENS PO Take by mouth daily.   CINNAMON PLUS CHROMIUM PO Take 1,000 mg by mouth 2 (two) times daily.   cyclobenzaprine 10 MG tablet Commonly known as:  FLEXERIL   DHA PO Take 400 mg by mouth daily.   ECHINACEA PO Take 760 mg by mouth daily.   EPINEPHrine 0.3 mg/0.3 mL Soaj injection Commonly known as:  EPIPEN 2-PAK Inject 0.3 mLs (0.3 mg total) into the muscle as needed.   ESTER C PO Take 1,000 mg by mouth daily.   estradiol 0.5 MG tablet Commonly known as:  ESTRACE Take 0.5 mg by mouth daily.   fexofenadine 180 MG tablet Commonly known as:  ALLEGRA Take 180 mg by mouth daily.   MUCINEX ALLERGY PO Take 400 mg by mouth 2 (two) times daily. Sometimes patient takes it 3 times daily.   gabapentin 300 MG capsule Commonly known as:  NEURONTIN   GARCINIA CAMBOGIA-CHROMIUM PO Take 3,000 mg by mouth daily. Reported on 09/02/2015   Ginger Root 550 MG Caps Take 550 mg by mouth daily.   GLUCOSAMINE CHONDROITIN MSM PO Take 1,500 mg by mouth daily.   hydrochlorothiazide 25 MG tablet Commonly known as:  HYDRODIURIL   KLOR-CON M10 10 MEQ tablet Generic drug:  potassium chloride Take by mouth.   KOREAN GINSENG PO Take 500 mg by mouth daily. Reported on 09/02/2015   losartan-hydrochlorothiazide 100-25 MG tablet Commonly known as:  HYZAAR   Magnesium 400 MG Caps Take by mouth daily.   MAXIMUM RED KRILL PO Take 1,000 mg by mouth daily.   meloxicam 15 MG tablet Commonly known as:  MOBIC   mometasone 50 MCG/ACT nasal spray Commonly known as:  NASONEX Place 2 sprays into the nose daily.   montelukast 10 MG tablet Commonly known as:  SINGULAIR TAKE ONE TABLET BY MOUTH AT BEDTIME   NONFORMULARY OR COMPOUNDED ITEM   omega-3 fish oil 1000 MG Caps  capsule Commonly known as:  MAXEPA Take 2 capsules by mouth daily.   optichamber diamond Misc USE AS DIRECTED.   PAPAYA ENZYME PO Take 66 mg by mouth daily.   ranitidine 150 MG tablet Commonly known as:  ZANTAC Take 150 mg by mouth daily.   SYMBICORT 160-4.5 MCG/ACT inhaler Generic drug:  budesonide-formoterol Inhale 2 puffs into the lungs 2 (two) times daily.       No Known Allergies  Review of systems: Constitutional: Negative for fever, chills and weight loss.  HENT: Positive for nosebleeds.   Eyes: Negative for blurred vision.  Respiratory: Negative for hemoptysis.   Cardiovascular: Negative for chest pain.  Gastrointestinal: Negative for diarrhea and constipation.  Genitourinary: Negative for dysuria.  Musculoskeletal: Negative for myalgias and joint pain.  Neurological: Negative for dizziness.  Endo/Heme/Allergies: Does not bruise/bleed easily.  Cutaneous: Negative for rash.  Past Medical History:  Diagnosis Date  . Arthritis   . Asthma   . Hypertension     Family History  Problem Relation Age of Onset  . Allergic rhinitis Mother   . Asthma Mother   . Angioedema Neg Hx   . Atopy Neg Hx   .  Eczema Neg Hx   . Urticaria Neg Hx   . Immunodeficiency Neg Hx     Social History   Social History  . Marital status: Married    Spouse name: N/A  . Number of children: N/A  . Years of education: N/A   Occupational History  . Not on file.   Social History Main Topics  . Smoking status: Never Smoker  . Smokeless tobacco: Never Used  . Alcohol use No  . Drug use: No  . Sexual activity: Not on file   Other Topics Concern  . Not on file   Social History Narrative  . No narrative on file    I appreciate the opportunity to take part in Jalesa's care. Please do not hesitate to contact me with questions.  Sincerely,   R. Jorene Guestarter Sloan Takagi, MD

## 2016-02-29 NOTE — Assessment & Plan Note (Signed)
   Continue appropriate allergen avoidance measures, immunotherapy, montelukast daily, and fexofenadine as needed.  Mometasone nasal spray will be discontinued at this time due to epistaxis.

## 2016-02-29 NOTE — Patient Instructions (Signed)
Moderate persistent asthma Improved and well controlled on omalizumab therapy.  Continue omalizumab injections every 2 weeks.  Now, continue Symbicort 160/4.5 g, 2 inhalations twice a day.  To maximize pulmonary deposition, a spacer has been provided along with instructions for its proper administration with an HFA inhaler.  Continue montelukast 10 mg daily bedtime and albuterol HFA, 1-2 inhalations every 4-6 hours as needed.  If Corinn's subjective and objective measures of pulmonary function remain stable, we will consider stepping down therapy on the next visit.  Epistaxis  Nasal saline gel is recommended to moisturize nasal mucosa.  During epistaxis, oxymetazoline may be used to help stanch blood flow if needed.  If this problem persists or progresses, otolaryngology evaluation may be warranted.   Allergic rhinitis due to pollen  Continue appropriate allergen avoidance measures, immunotherapy, montelukast daily, and fexofenadine as needed.  Mometasone nasal spray will be discontinued at this time due to epistaxis.   Return in about 4 months (around 06/30/2016), or if symptoms worsen or fail to improve.

## 2016-03-02 ENCOUNTER — Telehealth: Payer: Self-pay | Admitting: Allergy and Immunology

## 2016-03-05 ENCOUNTER — Other Ambulatory Visit: Payer: Self-pay | Admitting: Allergy

## 2016-03-05 ENCOUNTER — Ambulatory Visit (INDEPENDENT_AMBULATORY_CARE_PROVIDER_SITE_OTHER): Payer: BLUE CROSS/BLUE SHIELD

## 2016-03-05 DIAGNOSIS — J4540 Moderate persistent asthma, uncomplicated: Secondary | ICD-10-CM | POA: Diagnosis not present

## 2016-03-05 MED ORDER — SYMBICORT 160-4.5 MCG/ACT IN AERO: 2.0000 | INHALATION_SPRAY | Freq: Two times a day (BID) | RESPIRATORY_TRACT | 3 refills | Status: DC

## 2016-03-05 MED ORDER — MONTELUKAST SODIUM 10 MG PO TABS: 10.0000 mg | ORAL_TABLET | Freq: Every day | ORAL | 3 refills | Status: DC

## 2016-03-05 MED ORDER — MOMETASONE FUROATE 50 MCG/ACT NA SUSP: 2.0000 | Freq: Every day | NASAL | 3 refills | Status: DC

## 2016-03-05 NOTE — Telephone Encounter (Signed)
Pt informed nasonex, singulair and symbicort were sent to [facility] 90 days supply.

## 2016-03-07 ENCOUNTER — Other Ambulatory Visit: Payer: Self-pay | Admitting: *Deleted

## 2016-03-07 DIAGNOSIS — J4540 Moderate persistent asthma, uncomplicated: Secondary | ICD-10-CM

## 2016-03-07 MED ORDER — OMALIZUMAB 150 MG ~~LOC~~ SOLR
375.0000 mg | SUBCUTANEOUS | Status: DC
  Administered 2016-03-19 – 2016-08-14 (×9): 375 mg via SUBCUTANEOUS

## 2016-03-09 ENCOUNTER — Other Ambulatory Visit: Payer: Self-pay

## 2016-03-09 MED ORDER — MONTELUKAST SODIUM 10 MG PO TABS: 10.0000 mg | ORAL_TABLET | Freq: Every day | ORAL | 3 refills | Status: DC

## 2016-03-14 ENCOUNTER — Other Ambulatory Visit: Payer: Self-pay | Admitting: Allergy

## 2016-03-19 ENCOUNTER — Ambulatory Visit (INDEPENDENT_AMBULATORY_CARE_PROVIDER_SITE_OTHER): Payer: BLUE CROSS/BLUE SHIELD

## 2016-03-19 DIAGNOSIS — J4540 Moderate persistent asthma, uncomplicated: Secondary | ICD-10-CM

## 2016-04-03 ENCOUNTER — Ambulatory Visit: Payer: BLUE CROSS/BLUE SHIELD

## 2016-04-03 ENCOUNTER — Ambulatory Visit (INDEPENDENT_AMBULATORY_CARE_PROVIDER_SITE_OTHER): Payer: BLUE CROSS/BLUE SHIELD

## 2016-04-03 DIAGNOSIS — J4540 Moderate persistent asthma, uncomplicated: Secondary | ICD-10-CM | POA: Diagnosis not present

## 2016-04-17 ENCOUNTER — Ambulatory Visit: Payer: Self-pay

## 2016-04-17 ENCOUNTER — Ambulatory Visit (INDEPENDENT_AMBULATORY_CARE_PROVIDER_SITE_OTHER): Payer: BLUE CROSS/BLUE SHIELD

## 2016-04-17 DIAGNOSIS — J4540 Moderate persistent asthma, uncomplicated: Secondary | ICD-10-CM

## 2016-05-01 ENCOUNTER — Ambulatory Visit (INDEPENDENT_AMBULATORY_CARE_PROVIDER_SITE_OTHER): Payer: BLUE CROSS/BLUE SHIELD

## 2016-05-01 DIAGNOSIS — J4540 Moderate persistent asthma, uncomplicated: Secondary | ICD-10-CM

## 2016-05-02 DIAGNOSIS — J301 Allergic rhinitis due to pollen: Secondary | ICD-10-CM | POA: Diagnosis not present

## 2016-05-08 ENCOUNTER — Ambulatory Visit (INDEPENDENT_AMBULATORY_CARE_PROVIDER_SITE_OTHER): Payer: BLUE CROSS/BLUE SHIELD

## 2016-05-08 DIAGNOSIS — J309 Allergic rhinitis, unspecified: Secondary | ICD-10-CM

## 2016-05-08 NOTE — Progress Notes (Signed)
Immunotherapy   Patient Details  Name: Bridget Bell MRN: [id number] Date of Birth: [birth year]  05/08/2016   Bridget Bell here to pick up  red 1:100 grass-tree 0.10 Following schedule: C  Frequency:1 time per week Epi-Pen:Epi-Pen Available  Consent signed and patient instructions given.   Murray HodgkinsMichelle Clayvon Parlett 05/08/2016, 4:42 PM

## 2016-05-15 ENCOUNTER — Ambulatory Visit (INDEPENDENT_AMBULATORY_CARE_PROVIDER_SITE_OTHER): Payer: BLUE CROSS/BLUE SHIELD

## 2016-05-15 DIAGNOSIS — J4540 Moderate persistent asthma, uncomplicated: Secondary | ICD-10-CM

## 2016-05-29 ENCOUNTER — Ambulatory Visit (INDEPENDENT_AMBULATORY_CARE_PROVIDER_SITE_OTHER): Payer: BLUE CROSS/BLUE SHIELD

## 2016-05-29 DIAGNOSIS — J4540 Moderate persistent asthma, uncomplicated: Secondary | ICD-10-CM

## 2016-06-12 ENCOUNTER — Ambulatory Visit (INDEPENDENT_AMBULATORY_CARE_PROVIDER_SITE_OTHER): Payer: BLUE CROSS/BLUE SHIELD | Admitting: *Deleted

## 2016-06-12 DIAGNOSIS — J4540 Moderate persistent asthma, uncomplicated: Secondary | ICD-10-CM

## 2016-06-26 ENCOUNTER — Ambulatory Visit (INDEPENDENT_AMBULATORY_CARE_PROVIDER_SITE_OTHER): Payer: BLUE CROSS/BLUE SHIELD

## 2016-06-26 ENCOUNTER — Ambulatory Visit: Payer: BLUE CROSS/BLUE SHIELD

## 2016-06-26 DIAGNOSIS — J4540 Moderate persistent asthma, uncomplicated: Secondary | ICD-10-CM | POA: Diagnosis not present

## 2016-07-02 ENCOUNTER — Encounter: Payer: Self-pay | Admitting: Pediatrics

## 2016-07-02 ENCOUNTER — Ambulatory Visit (INDEPENDENT_AMBULATORY_CARE_PROVIDER_SITE_OTHER): Payer: BLUE CROSS/BLUE SHIELD | Admitting: Pediatrics

## 2016-07-02 VITALS — BP 130/90 | HR 92 | Temp 98.4°F | Resp 16 | Ht 64.0 in | Wt 196.0 lb

## 2016-07-02 DIAGNOSIS — J301 Allergic rhinitis due to pollen: Secondary | ICD-10-CM

## 2016-07-02 DIAGNOSIS — J4550 Severe persistent asthma, uncomplicated: Secondary | ICD-10-CM | POA: Insufficient documentation

## 2016-07-02 DIAGNOSIS — J0100 Acute maxillary sinusitis, unspecified: Secondary | ICD-10-CM | POA: Diagnosis not present

## 2016-07-02 DIAGNOSIS — I10 Essential (primary) hypertension: Secondary | ICD-10-CM | POA: Diagnosis not present

## 2016-07-02 MED ORDER — AZITHROMYCIN 250 MG PO TABS: ORAL_TABLET | ORAL | 0 refills | Status: DC

## 2016-07-02 NOTE — Patient Instructions (Signed)
Zithromax 250 mg-take 2 tablets today then 1 tablet once a day for the next 4 days Add prednisone 10 mg twice a day for 4 days 10 mg on the fifth day Continue on your other medications Call me if you are not doing well on this treatment plan

## 2016-07-02 NOTE — Progress Notes (Signed)
  [address] [place] [address] Dept: [phone]  FOLLOW UP NOTE  Patient ID: Bridget BathRonda Bell, female    DOB: 26-Jun-1961  Age: 55 y.o. MRN: [id number] Date of Office Visit: 07/02/2016  Assessment  Chief Complaint: Allergic Rhinitis ; Asthma; Cough (x's 1 week coughing green sputum); and Wheezing (x's 1 week)  HPI Bridget HoitRonda Bell presents for follow-up of asthma. She has been coughing and wheezing for about a week. It all began with a cold. She is bringing up a discolored mucus from her sinuses. She feels that she has done well with the use of an Xolair injections  Current medications are Xolair every 2 weeks, Symbicort 160-4.5-2 puffs twice a day, montelukast  10 mg once a day, albuterol 0.083% one unit dose every 4 hours if needed or instead Proventil 2 puffs every 4 hours if needed, Nasonex 2 sprays per nostril once a day, fexofenadine 180 mg once a day  Mucinex 1200 mg twice a day. Her other medications are outlined in the chart   Drug Allergies:  No Known Allergies  Physical Exam: BP 130/90 (BP Location: Right Arm, Patient Position: Sitting, Cuff Size: Normal)   Pulse 92   Temp 98.4 F (36.9 C) (Oral)   Resp 16   Ht 5\' 4"  (1.626 m)   Wt 196 lb (88.9 kg)   BMI 33.64 kg/m    Physical Exam  Constitutional: She is oriented to person, place, and time. She appears well-developed and well-nourished.  HENT:  Eyes normal. Ears normal. Nose mild swelling of nasal turbinates. Pharynx normal except for a green postnasal drainage  Neck: Neck supple.  Cardiovascular:  S1 and S2 normal no murmurs  Pulmonary/Chest:  Clear to percussion and auscultation  Lymphadenopathy:    She has no cervical adenopathy.  Neurological: She is alert and oriented to person, place, and time.  Psychiatric: She has a normal mood and affect. Her behavior is normal. Judgment and thought content normal.  Vitals reviewed.   Diagnostics:  FVC 3.18 L FEV1 2.65 L. Predicted FVC 3.40 L predicted FEV1 2.65  L-the spirometry is in the normal range  Assessment and Plan: 1. Severe persistent asthma without complication   2. Acute non-recurrent maxillary sinusitis   3. Acute seasonal allergic rhinitis due to pollen   4. Essential hypertension     Meds ordered this encounter  Medications  . azithromycin (ZITHROMAX) 250 MG tablet    Sig: Take 2 tablets today then 1 tablet daily for 4 days    Dispense:  6 each    Refill:  0    Patient Instructions  Zithromax 250 mg-take 2 tablets today then 1 tablet once a day for the next 4 days Add prednisone 10 mg twice a day for 4 days 10 mg on the fifth day Continue on your other medications Call me if you are not doing well on this treatment plan   Return in about 3 months (around 09/30/2016).    Thank you for the opportunity to care for this patient.  Please do not hesitate to contact me with questions.  Tonette BihariJ. A. Branae Crail, M.D.  [facility] [address] [place], [place] [address] [phone]

## 2016-07-10 ENCOUNTER — Other Ambulatory Visit: Payer: Self-pay | Admitting: Allergy

## 2016-07-10 ENCOUNTER — Ambulatory Visit: Payer: BLUE CROSS/BLUE SHIELD

## 2016-07-10 ENCOUNTER — Telehealth: Payer: Self-pay | Admitting: Allergy

## 2016-07-10 MED ORDER — PREDNISONE 10 MG PO TABS: ORAL_TABLET | ORAL | 0 refills | Status: DC

## 2016-07-10 MED ORDER — PREDNISONE 10 MG (21) PO TBPK: ORAL_TABLET | ORAL | 0 refills | Status: DC

## 2016-07-10 NOTE — Telephone Encounter (Signed)
Call her in prednisone 10 mg twice a day for 4 days, 10 mg on the fifth day. Continue on her other medications

## 2016-07-10 NOTE — Telephone Encounter (Signed)
Patient called said her head cleared up some. Still coughing, wheezing tightness in chest. Still using rescue inhaler. Wanted to know if you could call in some more prednisone? Uses [facility] [place]. [phone]

## 2016-07-10 NOTE — Telephone Encounter (Signed)
done

## 2016-07-10 NOTE — Telephone Encounter (Signed)
Prednisone sent in. Informed pt.

## 2016-07-31 ENCOUNTER — Ambulatory Visit: Payer: BLUE CROSS/BLUE SHIELD

## 2016-08-14 ENCOUNTER — Ambulatory Visit (INDEPENDENT_AMBULATORY_CARE_PROVIDER_SITE_OTHER): Payer: BLUE CROSS/BLUE SHIELD

## 2016-08-14 DIAGNOSIS — J4540 Moderate persistent asthma, uncomplicated: Secondary | ICD-10-CM

## 2016-08-15 ENCOUNTER — Telehealth: Payer: Self-pay | Admitting: *Deleted

## 2016-08-15 NOTE — Telephone Encounter (Signed)
Calling to change pharmacy to [facility].

## 2016-08-27 ENCOUNTER — Telehealth: Payer: Self-pay | Admitting: Allergy

## 2016-08-27 MED ORDER — PREDNISONE 10 MG PO TABS: ORAL_TABLET | ORAL | 0 refills | Status: DC

## 2016-08-27 NOTE — Telephone Encounter (Signed)
Patient called said she was coughing real bad, wheezing, tight in chest .nose stuffy. No fever. Using nebulizer every 4 hours. Using all meds.Gilmer MorCane you call her in something Phone [phone]. Pt. [facility] [place].

## 2016-08-27 NOTE — Telephone Encounter (Signed)
Call in prednisone 10 mg tablets. Take 2 tablets twice a day for 3 days, 2 tablets on the fourth day, one tablet on the fifth day. Continue on your other medications

## 2016-08-27 NOTE — Telephone Encounter (Signed)
Informed patient. [facility].

## 2016-08-28 ENCOUNTER — Ambulatory Visit: Payer: BLUE CROSS/BLUE SHIELD

## 2016-08-29 ENCOUNTER — Other Ambulatory Visit: Payer: Self-pay | Admitting: Allergy

## 2016-08-29 MED ORDER — ALBUTEROL SULFATE HFA 108 (90 BASE) MCG/ACT IN AERS: 2.0000 | INHALATION_SPRAY | RESPIRATORY_TRACT | 1 refills | Status: DC | PRN

## 2016-08-29 MED ORDER — MONTELUKAST SODIUM 10 MG PO TABS: 10.0000 mg | ORAL_TABLET | Freq: Every day | ORAL | 1 refills | Status: DC

## 2016-08-29 MED ORDER — MOMETASONE FUROATE 50 MCG/ACT NA SUSP: 2.0000 | Freq: Every day | NASAL | 1 refills | Status: DC

## 2016-08-29 MED ORDER — SYMBICORT 160-4.5 MCG/ACT IN AERO
2.0000 | INHALATION_SPRAY | Freq: Two times a day (BID) | RESPIRATORY_TRACT | 1 refills | Status: DC
Start: 2016-08-29 — End: 2017-04-09

## 2016-08-31 ENCOUNTER — Telehealth: Payer: Self-pay | Admitting: *Deleted

## 2016-08-31 ENCOUNTER — Encounter: Payer: Self-pay | Admitting: Allergy & Immunology

## 2016-08-31 ENCOUNTER — Ambulatory Visit (INDEPENDENT_AMBULATORY_CARE_PROVIDER_SITE_OTHER): Payer: BLUE CROSS/BLUE SHIELD | Admitting: Allergy & Immunology

## 2016-08-31 VITALS — BP 126/76 | HR 104 | Temp 97.9°F | Resp 20

## 2016-08-31 DIAGNOSIS — J4550 Severe persistent asthma, uncomplicated: Secondary | ICD-10-CM | POA: Diagnosis not present

## 2016-08-31 DIAGNOSIS — J209 Acute bronchitis, unspecified: Secondary | ICD-10-CM

## 2016-08-31 DIAGNOSIS — J301 Allergic rhinitis due to pollen: Secondary | ICD-10-CM

## 2016-08-31 MED ORDER — AZITHROMYCIN 250 MG PO TABS: ORAL_TABLET | ORAL | 0 refills | Status: DC

## 2016-08-31 MED ORDER — METHYLPREDNISOLONE ACETATE 40 MG/ML IJ SUSP
40.0000 mg | Freq: Once | INTRAMUSCULAR | Status: AC
  Administered 2016-08-31: 40 mg via INTRAMUSCULAR

## 2016-08-31 NOTE — Patient Instructions (Addendum)
1. Severe persistent asthma without complication - Spirometry looked normal. - We did give you a DuoNeb treatment to get you tuned up. - Since you are having several exacerbations even on the Xolair, we will look into changing to a different biologic.  - Get the CBC with differential to check to see if you qualify for either Fasenra or Nucala (anti-eosinophil agents)  2. Acute seasonal allergic rhinitis due to pollen - Continue with allergy shots. - Continue with Nasonex. - Continue with nasal saline rinses.   3. Acute bronchitis - Start azithromycin antibiotic: two tablet daily today and then one tablet daily for the next four days. - The prednisone should help improve symptoms as well.   4. Return in about 3 months (around 11/28/2016).  Please inform us of any Emergency Department visits, hospitalizations, or changes in symptoms. Call us before going to the ED for breathing or allergy symptoms since we might be able to fit you in for a sick visit. Feel free to contact us anytime with any questions, problems, or concerns.  It was a pleasure to meet you today! Best wishes in the [place] Year!   Websites that have reliable patient information: 1. American Academy of Asthma, Allergy, and Immunology: www.aaaai.org 2. Food Allergy Research and Education (FARE): foodallergy.org 3. Mothers of Asthmatics: http://www.asthmacommunitynetwork.org 4. American College of Allergy, Asthma, and Immunology: www.acaai.org

## 2016-08-31 NOTE — Progress Notes (Addendum)
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 Severe persistent asthma  - Spirometry looked normal. - We did give you a DuoNeb treatment to get you tuned up. - Since you are having several exacerbations even on the Xolair, we will look into changing to a different biologic.  - Get the CBC with differential to check to see if you qualify for either Fasenra or Nucala (anti-eosinophil agents). - She did not fill out the forms today but instead took informational pamphlets. - If her blood work shows that she qualifies, we will have her fill out the forms.  - She has been on prednisone for three days now, however, which would artificially lower her eosinophil count. - Therefore I asked her to get a CBC in 2-4 weeks after the prednisone has stopped. - Also we should consider the diagnosis of vocal cord dysfunction in the future, as the patient had normal spirometry today and was wheezing despite this.  - The lack of marked improvement with albuterol also goes along with VCD. - We will address this at future visits.  2. Acute seasonal allergic rhinitis - Continue with allergy shots. - Continue with Nasonex. - Continue with nasal saline rinses.   3. Acute bronchitis - Start azithromycin antibiotic: two tablet daily today and then one tablet daily for the next four days. - The prednisone should help improve symptoms as well.   4. Return in about 3 months (around 11/28/2016).   Subjective:   Bridget Bell is a 56 y.o. female presenting today for follow up of  Chief Complaint  Patient presents with  . Cough    productive with green sputum. chills. body aches. will finish prednisone tomorrow. does have influenza exposure due to employment. had URI in  December.     Bridget Bell has a history of the following: Patient Active Problem List   Diagnosis Date Noted  . Severe persistent asthma without complication 07/02/2016  . Acute non-recurrent maxillary sinusitis 07/02/2016  . Essential hypertension 07/02/2016  . Epistaxis 02/29/2016  . Moderate persistent asthma 08/23/2015  . Gastroesophageal reflux disease without esophagitis 08/23/2015  . Allergic rhinitis due to pollen 05/20/2015    History obtained from: chart review and patient.  Bridget Bell was referred by Molinda Bailiff, PA.     Bridget Bell is a 56 y.o. female presenting for a sick visit. She was last seen in December 2017 by Dr. Beaulah Dinning. At that time, she was coughing and wheezing. She was continued on Symbicort 160/4.5 two puffs the morning and 2 puffs at night, Singulair 10 mg daily. She was also started on azithromycin. She is on Xolair. She shows up today 30 minutes late.   Since the last visit, she has not done very well. She did improve following the burst of prednisone as well as the azithromycin. However, since Saturday evening seven days ago, she has had URI symptoms with wheezing and coughing. 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 She has been using nasal saline rinses as well as her albuterol with minimal improvement. She did call into the office earlier this week and a prednisone burst was started over the phone. Since starting the prednisone, her cough has improved somewhat.   Bridget Bell  would also like to discuss her asthma medications today. She has been on Xolair for nearly 10 months without any perceived benefit. In fact, she has had four exacerbations in the last 10 months requiring steroids. She is on the Symbicort with a spacer, but still feels as if the Xolair is not doing much of anything.  Bridget Bell does have a history of allergic rhinitis but she was on shots for nearly 15 years.    Otherwise, there have been no changes to her past  medical history, surgical history, family history, or social history.    Review of Systems: a 14-point review of systems is pertinent for what is mentioned in HPI.  Otherwise, all other systems were negative. Constitutional: negative other than that listed in the HPI Eyes: negative other than that listed in the HPI Ears, nose, mouth, throat, and face: negative other than that listed in the HPI Respiratory: negative other than that listed in the HPI Cardiovascular: negative other than that listed in the HPI Gastrointestinal: negative other than that listed in the HPI Genitourinary: negative other than that listed in the HPI Integument: negative other than that listed in the HPI Hematologic: negative other than that listed in the HPI Musculoskeletal: negative other than that listed in the HPI Neurological: negative other than that listed in the HPI Allergy/Immunologic: negative other than that listed in the HPI    Objective:   Blood pressure 126/76, pulse (!) 104, temperature 97.9 F (36.6 C), temperature source Oral, resp. rate 20, SpO2 97 %. There is no height or weight on file to calculate BMI.   Physical Exam:  General: Alert, interactive, in no acute distress. Pleasant. Overly talkative.  Eyes: No conjunctival injection present on the right, No conjunctival injection present on the left, PERRL bilaterally, No discharge on the right, No discharge on the left and No Horner-Trantas dots present Ears: Right TM pearly gray with normal light reflex, Left TM pearly gray with normal light reflex, Right TM intact without perforation and Left TM intact without perforation.  Nose/Throat: External nose within normal limits and septum midline, turbinates edematous with clear discharge, post-pharynx moderately erythematous without cobblestoning in the posterior oropharynx. Tonsils 2+ without exudates Neck: Supple without thyromegaly. Lungs: Clear to auscultation without wheezing, rhonchi or  rales. No increased work of breathing. CV: Normal S1/S2, no murmurs. Capillary refill <2 seconds.  Skin: Warm and dry, without lesions or rashes. Neuro:   Grossly intact. No focal deficits appreciated. Responsive to questions.   Diagnostic studies:  Spirometry: results normal (FEV1: 2.76/105%, FVC: 3.19/94%, FEV1/FVC: 87%).    Spirometry consistent with normal pattern. Albuterol/Atrovent nebulizer treatment given in clinic with improvement, although it did not reach significance per ATS criteria. Her forced vital capacity increased 7% and her FEV1 increased 2%.    Malachi BondsJoel Gallagher, MD FAAAAI [facility]

## 2016-08-31 NOTE — Telephone Encounter (Signed)
Patient came in for apt, both dr gallagher and patient agreed to discontinue xolair. Not helping.

## 2016-08-31 NOTE — Telephone Encounter (Signed)
I got it will D/C her Xolair

## 2016-09-03 NOTE — Progress Notes (Signed)
Will try to keep eye out for it. Already D/C Xolair

## 2016-09-10 NOTE — Addendum Note (Signed)
Addended by: Berna BueWHITAKER, Kavya Haag L on: 09/10/2016 04:49 PM   Modules accepted: Orders

## 2016-09-11 ENCOUNTER — Ambulatory Visit: Payer: BLUE CROSS/BLUE SHIELD

## 2016-09-20 DIAGNOSIS — J3089 Other allergic rhinitis: Secondary | ICD-10-CM | POA: Diagnosis not present

## 2016-10-02 ENCOUNTER — Ambulatory Visit (INDEPENDENT_AMBULATORY_CARE_PROVIDER_SITE_OTHER): Payer: BLUE CROSS/BLUE SHIELD

## 2016-10-02 DIAGNOSIS — J309 Allergic rhinitis, unspecified: Secondary | ICD-10-CM | POA: Diagnosis not present

## 2016-10-02 MED ORDER — EPINEPHRINE 0.3 MG/0.3ML IJ SOAJ: INTRAMUSCULAR | 3 refills | Status: DC

## 2016-10-02 NOTE — Progress Notes (Signed)
Immunotherapy   Patient Details  Name: Bridget Bell MRN: [id number] Date of Birth: 1960-09-28  10/02/2016  Bridget Bell here to pick up Red 1:100 ( Grass- Tree) Following schedule: C  Frequency:1 time per week, at .50 Q 2 weeks Epi-Pen:Epi-Pen Available  Consent signed and patient instructions given. No problems   [name] 10/02/2016, 5:12 PM

## 2016-11-08 ENCOUNTER — Other Ambulatory Visit: Payer: Self-pay

## 2016-11-08 MED ORDER — ALBUTEROL SULFATE (2.5 MG/3ML) 0.083% IN NEBU: INHALATION_SOLUTION | RESPIRATORY_TRACT | 1 refills | Status: DC

## 2016-11-09 ENCOUNTER — Other Ambulatory Visit: Payer: Self-pay

## 2016-11-09 MED ORDER — ALBUTEROL SULFATE (2.5 MG/3ML) 0.083% IN NEBU: INHALATION_SOLUTION | RESPIRATORY_TRACT | 1 refills | Status: DC

## 2016-11-09 NOTE — Telephone Encounter (Signed)
RF for Albuterol 0.083 x1 given

## 2016-11-30 ENCOUNTER — Ambulatory Visit: Payer: BLUE CROSS/BLUE SHIELD | Admitting: Allergy & Immunology

## 2016-12-05 LAB — CBC WITH DIFFERENTIAL/PLATELET
BASOS PCT: 1 %
Basophils Absolute: 76 cells/uL (ref 0–200)
EOS ABS: 228 {cells}/uL (ref 15–500)
EOS PCT: 3 %
HCT: 40.8 % (ref 35.0–45.0)
Hemoglobin: 13.6 g/dL (ref 11.7–15.5)
Lymphocytes Relative: 20 %
Lymphs Abs: 1520 cells/uL (ref 850–3900)
MCH: 30.2 pg (ref 27.0–33.0)
MCHC: 33.3 g/dL (ref 32.0–36.0)
MCV: 90.7 fL (ref 80.0–100.0)
MONOS PCT: 9 %
MPV: 10.2 fL (ref 7.5–12.5)
Monocytes Absolute: 684 cells/uL (ref 200–950)
NEUTROS ABS: 5092 {cells}/uL (ref 1500–7800)
Neutrophils Relative %: 67 %
PLATELETS: 299 10*3/uL (ref 140–400)
RBC: 4.5 MIL/uL (ref 3.80–5.10)
RDW: 13.6 % (ref 11.0–15.0)
WBC: 7.6 10*3/uL (ref 3.8–10.8)

## 2016-12-14 ENCOUNTER — Ambulatory Visit: Payer: BLUE CROSS/BLUE SHIELD | Admitting: Allergy & Immunology

## 2016-12-14 ENCOUNTER — Telehealth: Payer: Self-pay | Admitting: Allergy & Immunology

## 2016-12-14 NOTE — Telephone Encounter (Signed)
Tammy has confirmed that the Harrington ChallengerFasenra will arrive on May 30. Tammy did call her to make an appointment for May 31 for her first injection.  Malachi BondsJoel Nassir Neidert, MD FAAAAI [facility] 

## 2016-12-14 NOTE — Telephone Encounter (Signed)
Thank you, Tammy! I guess we had spoken about this one then. I appreciate it!   Malachi BondsJoel Brooklyn Alfredo, MD FAAAAI [facility]

## 2016-12-14 NOTE — Telephone Encounter (Signed)
We  Called Ms. Ducat to let her know that the Harrington ChallengerFasenra is not in stock today. I was unaware that she officially wanted to start it, therefore we cancelled her appointment. We will talk to Tammy to get this ordered and see what we can get started.   Thanks, Malachi BondsJoel Taeshaun Rames, MD FAAAAI [facility]

## 2016-12-14 NOTE — Telephone Encounter (Signed)
Patient said she can not come Friday but will be able to come earlier during the week. Will call when medication arrives. I told her that an office visit was not needed, only a immunotherapy appointment.

## 2016-12-14 NOTE — Telephone Encounter (Signed)
I have got her insurance approval and prescription was sent to [facility] on 5/23 so she should be hearing from them around the first of the week or sooner.

## 2016-12-20 ENCOUNTER — Ambulatory Visit (INDEPENDENT_AMBULATORY_CARE_PROVIDER_SITE_OTHER): Payer: BLUE CROSS/BLUE SHIELD

## 2016-12-20 DIAGNOSIS — J4550 Severe persistent asthma, uncomplicated: Secondary | ICD-10-CM | POA: Diagnosis not present

## 2017-01-04 ENCOUNTER — Ambulatory Visit: Payer: BLUE CROSS/BLUE SHIELD | Admitting: Allergy & Immunology

## 2017-01-16 ENCOUNTER — Ambulatory Visit (INDEPENDENT_AMBULATORY_CARE_PROVIDER_SITE_OTHER): Payer: BLUE CROSS/BLUE SHIELD

## 2017-01-16 DIAGNOSIS — J4550 Severe persistent asthma, uncomplicated: Secondary | ICD-10-CM

## 2017-01-16 MED ORDER — BENRALIZUMAB 30 MG/ML ~~LOC~~ SOSY: 30.0000 mg | PREFILLED_SYRINGE | SUBCUTANEOUS | Status: DC

## 2017-01-16 MED ORDER — BENRALIZUMAB 30 MG/ML ~~LOC~~ SOSY
30.0000 mg | PREFILLED_SYRINGE | SUBCUTANEOUS | Status: DC
  Administered 2017-01-16: 30 mg via SUBCUTANEOUS

## 2017-01-16 MED ORDER — BENRALIZUMAB 30 MG/ML ~~LOC~~ SOSY
30.0000 mg | PREFILLED_SYRINGE | SUBCUTANEOUS | Status: DC
  Administered 2017-03-13 – 2019-01-27 (×12): 30 mg via SUBCUTANEOUS

## 2017-01-31 DIAGNOSIS — J301 Allergic rhinitis due to pollen: Secondary | ICD-10-CM | POA: Diagnosis not present

## 2017-01-31 NOTE — Progress Notes (Signed)
VIALS EXP 01-31-18 

## 2017-02-13 ENCOUNTER — Ambulatory Visit: Payer: Self-pay

## 2017-02-14 ENCOUNTER — Ambulatory Visit (INDEPENDENT_AMBULATORY_CARE_PROVIDER_SITE_OTHER): Payer: BLUE CROSS/BLUE SHIELD

## 2017-02-14 DIAGNOSIS — J309 Allergic rhinitis, unspecified: Secondary | ICD-10-CM | POA: Diagnosis not present

## 2017-02-14 NOTE — Progress Notes (Signed)
Immunotherapy   Patient Details  Name: Bridget Bell MRN: [id number] Date of Birth: Jan 17, 1961  02/14/2017  Bridget Bell  PICK UP RED VIAL Following schedule: C  Frequency:ONCE A WEEK UNTIL 0.5 THEN EVERY 3 WEEKS Epi-Pen:YES Consent signed and patient instructions given.   Bridget Bell 02/14/2017, 2:33 PM

## 2017-02-15 ENCOUNTER — Encounter: Payer: Self-pay | Admitting: Allergy & Immunology

## 2017-02-15 ENCOUNTER — Ambulatory Visit (INDEPENDENT_AMBULATORY_CARE_PROVIDER_SITE_OTHER): Payer: BLUE CROSS/BLUE SHIELD | Admitting: Allergy & Immunology

## 2017-02-15 ENCOUNTER — Other Ambulatory Visit: Payer: Self-pay

## 2017-02-15 VITALS — BP 100/70 | HR 88 | Temp 97.9°F | Resp 16

## 2017-02-15 DIAGNOSIS — B999 Unspecified infectious disease: Secondary | ICD-10-CM

## 2017-02-15 DIAGNOSIS — J4550 Severe persistent asthma, uncomplicated: Secondary | ICD-10-CM | POA: Diagnosis not present

## 2017-02-15 DIAGNOSIS — J209 Acute bronchitis, unspecified: Secondary | ICD-10-CM | POA: Diagnosis not present

## 2017-02-15 DIAGNOSIS — K219 Gastro-esophageal reflux disease without esophagitis: Secondary | ICD-10-CM

## 2017-02-15 DIAGNOSIS — J3089 Other allergic rhinitis: Secondary | ICD-10-CM | POA: Diagnosis not present

## 2017-02-15 DIAGNOSIS — J302 Other seasonal allergic rhinitis: Secondary | ICD-10-CM | POA: Diagnosis not present

## 2017-02-15 MED ORDER — HYDROCOD POLST-CPM POLST ER 10-8 MG/5ML PO SUER: 5.0000 mL | Freq: Two times a day (BID) | ORAL | 0 refills | Status: AC | PRN

## 2017-02-15 MED ORDER — DOXYCYCLINE HYCLATE 100 MG PO TABS: 100.0000 mg | ORAL_TABLET | Freq: Two times a day (BID) | ORAL | 0 refills | Status: AC

## 2017-02-15 MED ORDER — DOXYCYCLINE HYCLATE 100 MG PO TABS: ORAL_TABLET | ORAL | 0 refills | Status: DC

## 2017-02-15 NOTE — Progress Notes (Signed)
FOLLOW UP  Date of Service/Encounter:  02/15/17   Assessment:   Severe persistent asthma - with possible VCD component  Seasonal and perennial allergic rhinitis (grass, tree) - on allergen immunotherapy  Gastroesophageal reflux disease  Recurrent infections   Asthma Reportables:  Severity: severe persistent  Risk: high Control: well controlled   Plan/Recommendations:    1. Severe persistent asthma without complication - Spirometry looked normal today.  - I still feel that vocal cord dysfunction could be contributing to her symptoms, but since she is stable we will not address this today. - We will not make any changes today, although I did offer decreasing her Symbicort to the 80/4.5 g dose.  - However, she feels that with starting school in the fall, she may have worsening symptoms.  - It seems that the Harrington ChallengerFasenra is working well, but the true test will be when you start school again in the fall. - Daily controller medication(s): Symbicort 160/4.5 two puffs twice daily + Singulair 10mg  daily + Fasenra every two weeks - Rescue medications: ProAir 4 puffs every 4-6 hours as needed, albuterol nebulizer one vial puffs every 4-6 hours as needed or DuoNeb nebulizer one vial every 4-6 hours as needed - Asthma control goals:  * Full participation in all desired activities (may need albuterol before activity) * Albuterol use two time or less a week on average (not counting use with activity) * Cough interfering with sleep two time or less a month * Oral steroids no more than once a year * No hospitalizations  2. Chronic allergic rhinitis (grass, tree) - Continue with allergy shots. - Continue with Nasonex. - Continue Allegra 180mg  daily.  3. Acute bronchitis - Start doxyxycline 100mg  twice daily for two weeks.  - Restart nasal saline rinses.  - Start Tussionex 5mL every 12 hours as needed for coughing (we will give you five days worth)  4. Recurrent infections - We will  complete her workup with a pneumococcal titer today.  - If the pneumococcal titers are not protective again, we might be able to get immunoglobulin replacement approved. - However, she actually seems to be doing quite well on the [place]. - We will be difficult to justify immunoglobulin replacement given her current clinical state.  5. Return in about 3 months (around 05/18/2017).  Subjective:   Bridget Bell is a 56 y.o. female presenting today for follow up of  Chief Complaint  Patient presents with  . Cough  . Nasal Congestion    Bridget BathRonda Beckles has a history of the following: Patient Active Problem List   Diagnosis Date Noted  . Recurrent infections 02/15/2017  . Seasonal and perennial allergic rhinitis 02/15/2017  . Severe persistent asthma without complication 07/02/2016  . Acute non-recurrent maxillary sinusitis 07/02/2016  . Essential hypertension 07/02/2016  . Epistaxis 02/29/2016  . Moderate persistent asthma 08/23/2015  . Gastroesophageal reflux disease without esophagitis 08/23/2015  . Allergic rhinitis due to pollen 05/20/2015    History obtained from: chart review and patient.  Bridget BathRonda Casanova was referred by Molinda BailiffSmith, Ernest, PA.     [name] is a 56 y.o. female presenting for a sick visit. She was last seen in the office in February 2018. At that time, she was complaining of worsening coughing and wheezing. Her spirometry was normal, and she did feel better following a DuoNeb treatment. She had been on Xolair for almost a year or more, without any improvement in her exacerbation rate. We obtained a complete blood count that showed an  absolute eosinophil count of 228. Therefore, we changed her to Harrington ChallengerFasenra which she started at the end of May. She has received 2 injections thus far. At the last visit, we also gave her a steroid injection to help with managing her inflammation. We also started her on a azithromycin course.  Since the last visit, she has mostly done well.  She has  continued on her Symbicort as well as her Singulair. She has tolerated her Fasenra doses without adverse event. She is not sure if they are helping, but it should be noted that she has received no prednisone since the last visit in February. This is 5 months in total without a glucocorticoid, which she did not have when she was on Xolair. She has required no Emergency Department or Urgent Care visits for her asthma. She has required zero courses of systemic steroids for asthma exacerbations since the last visit. ACT score today is 11, indicating terrible asthma symptom control. However, her lack of symptom control really has started around 2 weeks ago. Prior to that, she was doing quite well. Over the last 2 weeks, she has been using her albuterol 2-3 times per day. This does seem to help the persistent cough. She also endorses postnasal drip as well as some mild sinus pressure. She is having a cough productive of yellow-green sputum. She has remained afebrile.  She remains on her allergy shots, which she is tolerating well. She is going to be spacing out every 3 weeks very soon. She remains on Nasonex as well as Allegra daily. Since February, she has actually done quite well, with the last 2 weeks excluded. However, despite this, she reports that she is "sick all the time". She thought that the Harrington ChallengerFasenra was going to help with her illnesses. She did have a workup for recurrent infections approximately 18 months ago. It showed a normal immunoglobulin panel as well as protective titers to diphtheria and tetanus. Her pneumococcal titers were only protective to 3 out of 14 serotypes. She is unsure whether she got a Pneumovax, however we talked to her primary care provider and she did receive one in March 2017. She definitely did not get repeat titers to see whether her body responded to the Pneumovax.  Otherwise, there have been no changes to her past medical history, surgical history, family history, or social  history.    Review of Systems: a 14-point review of systems is pertinent for what is mentioned in HPI.  Otherwise, all other systems were negative. Constitutional: negative other than that listed in the HPI Eyes: negative other than that listed in the HPI Ears, nose, mouth, throat, and face: negative other than that listed in the HPI Respiratory: negative other than that listed in the HPI Cardiovascular: negative other than that listed in the HPI Gastrointestinal: negative other than that listed in the HPI Genitourinary: negative other than that listed in the HPI Integument: negative other than that listed in the HPI Hematologic: negative other than that listed in the HPI Musculoskeletal: negative other than that listed in the HPI Neurological: negative other than that listed in the HPI Allergy/Immunologic: negative other than that listed in the HPI    Objective:   Blood pressure 100/70, pulse 88, temperature 97.9 F (36.6 C), temperature source Oral, resp. rate 16, SpO2 100 %. There is no height or weight on file to calculate BMI.   Physical Exam:  General: Alert, interactive, in no acute distress. Well-coiffed female.  Eyes: No conjunctival injection present on  the right, No conjunctival injection present on the left, PERRL bilaterally, No discharge on the right, No discharge on the left and No Horner-Trantas dots present Ears: Right TM pearly gray with normal light reflex, Left TM pearly gray with normal light reflex, Right TM intact without perforation and Left TM intact without perforation.  Nose/Throat: External nose within normal limits and septum midline, turbinates edematous with clear discharge, post-pharynx erythematous without cobblestoning in the posterior oropharynx. Tonsils 2+ without exudates Neck: Supple without thyromegaly. Lungs: Clear to auscultation without wheezing, rhonchi or rales. No increased work of breathing. CV: Normal S1/S2, no murmurs. Capillary  refill <2 seconds.  Skin: Warm and dry, without lesions or rashes. Neuro:   Grossly intact. No focal deficits appreciated. Responsive to questions.   Diagnostic studies:   Spirometry: results normal (FEV1: 2.68/102%, FVC: 3.32/98%, FEV1/FVC: 81%) .    Spirometry consistent with normal pattern.   Allergy Studies: none    Malachi Bonds, MD [facility] [place]

## 2017-02-15 NOTE — Patient Instructions (Addendum)
1. Severe persistent asthma without complication - Spirometry looked normal today.  - We will not make any changes today.  - It seems that the Harrington ChallengerFasenra is working well, but the true test will be when you start school again in the fall. - Daily controller medication(s): Symbicort 160/4.5 two puffs twice daily + Singulair 10mg  daily + Fasenra every two weeks - Rescue medications: ProAir 4 puffs every 4-6 hours as needed, albuterol nebulizer one vial puffs every 4-6 hours as needed or DuoNeb nebulizer one vial every 4-6 hours as needed - Asthma control goals:  * Full participation in all desired activities (may need albuterol before activity) * Albuterol use two time or less a week on average (not counting use with activity) * Cough interfering with sleep two time or less a month * Oral steroids no more than once a year * No hospitalizations  2. Chronic allergic rhinitis - Continue with allergy shots. - Continue with Nasonex. - Continue with nasal saline rinses.   3. Acute bronchitis - Start doxyxycline 100mg  twice daily for two weeks.  - Restart nasal saline rinses.  - Start Tussionex 5mL every 12 hours as needed for coughing (we will give you five days worth)  4. Return in about 3 months (around 05/18/2017).  Please inform us of any Emergency Department visits, hospitalizations, or changes in symptoms. Call us before going to the ED for breathing or allergy symptoms since we might be able to fit you in for a sick visit. Feel free to contact us anytime with any questions, problems, or concerns.  It was a pleasure to see you again today! Enjoy the rest of the summer!   Websites that have reliable patient information: 1. American Academy of Asthma, Allergy, and Immunology: www.aaaai.org 2. Food Allergy Research and Education (FARE): foodallergy.org 3. Mothers of Asthmatics: http://www.asthmacommunitynetwork.org 4. American College of Allergy, Asthma, and Immunology: www.acaai.org

## 2017-02-25 LAB — STREP PNEUMONIAE 14 SEROTYPES IGG
PNEUMO AB TYPE 19 (19F): 4.8 ug/mL (ref >1.3)
PNEUMO AB TYPE 26 (6B): 2.6 ug/mL (ref >1.3)
PNEUMO AB TYPE 51 (7F): 1.1 ug/mL — AB (ref >1.3)
PNEUMO AB TYPE 56 (18C): 4 ug/mL (ref >1.3)
PNEUMO AB TYPE 68 (9V): 5.5 ug/mL (ref >1.3)
Pneumo Ab Type 1*: >33.8 ug/mL (ref >1.3)
Pneumo Ab Type 12 (12F)*: 7.1 ug/mL (ref >1.3)
Pneumo Ab Type 14*: >46.6 ug/mL (ref >1.3)
Pneumo Ab Type 3*: 0.6 ug/mL — ABNORMAL LOW (ref >1.3)
Pneumo Ab Type 4*: 1.4 ug/mL (ref >1.3)
Pneumo Ab Type 8*: 0.6 ug/mL — ABNORMAL LOW (ref >1.3)
Pneumo Ab Type 9 (9N)*: 10.4 ug/mL (ref >1.3)

## 2017-03-13 ENCOUNTER — Ambulatory Visit (INDEPENDENT_AMBULATORY_CARE_PROVIDER_SITE_OTHER): Payer: BLUE CROSS/BLUE SHIELD

## 2017-03-13 DIAGNOSIS — J4550 Severe persistent asthma, uncomplicated: Secondary | ICD-10-CM | POA: Diagnosis not present

## 2017-03-18 ENCOUNTER — Other Ambulatory Visit: Payer: Self-pay | Admitting: Allergy

## 2017-03-18 MED ORDER — MONTELUKAST SODIUM 10 MG PO TABS: 10.0000 mg | ORAL_TABLET | Freq: Every day | ORAL | 1 refills | Status: DC

## 2017-04-09 ENCOUNTER — Other Ambulatory Visit: Payer: Self-pay | Admitting: Allergy

## 2017-04-09 MED ORDER — MOMETASONE FUROATE 50 MCG/ACT NA SUSP: 2.0000 | Freq: Every day | NASAL | 1 refills | Status: DC

## 2017-04-09 MED ORDER — SYMBICORT 160-4.5 MCG/ACT IN AERO: 2.0000 | INHALATION_SPRAY | Freq: Two times a day (BID) | RESPIRATORY_TRACT | 1 refills | Status: DC

## 2017-04-09 NOTE — Telephone Encounter (Signed)
[facility] called and said Bridget Bell needed her Symbicort and Nasonex refilled. Faxed in with  90 day supply.

## 2017-05-08 ENCOUNTER — Ambulatory Visit (INDEPENDENT_AMBULATORY_CARE_PROVIDER_SITE_OTHER): Payer: BLUE CROSS/BLUE SHIELD

## 2017-05-08 DIAGNOSIS — J4550 Severe persistent asthma, uncomplicated: Secondary | ICD-10-CM

## 2017-06-07 ENCOUNTER — Encounter: Payer: Self-pay | Admitting: Allergy & Immunology

## 2017-06-07 ENCOUNTER — Ambulatory Visit (INDEPENDENT_AMBULATORY_CARE_PROVIDER_SITE_OTHER): Payer: BLUE CROSS/BLUE SHIELD | Admitting: Allergy & Immunology

## 2017-06-07 VITALS — BP 122/80 | HR 99 | Temp 98.1°F | Resp 18

## 2017-06-07 DIAGNOSIS — J3089 Other allergic rhinitis: Secondary | ICD-10-CM

## 2017-06-07 DIAGNOSIS — K219 Gastro-esophageal reflux disease without esophagitis: Secondary | ICD-10-CM | POA: Diagnosis not present

## 2017-06-07 DIAGNOSIS — J0100 Acute maxillary sinusitis, unspecified: Secondary | ICD-10-CM

## 2017-06-07 DIAGNOSIS — J302 Other seasonal allergic rhinitis: Secondary | ICD-10-CM

## 2017-06-07 DIAGNOSIS — B999 Unspecified infectious disease: Secondary | ICD-10-CM | POA: Diagnosis not present

## 2017-06-07 DIAGNOSIS — J4550 Severe persistent asthma, uncomplicated: Secondary | ICD-10-CM | POA: Diagnosis not present

## 2017-06-07 MED ORDER — AMOXICILLIN-POT CLAVULANATE 875-125 MG PO TABS: 1.0000 | ORAL_TABLET | Freq: Two times a day (BID) | ORAL | 0 refills | Status: AC

## 2017-06-07 NOTE — Progress Notes (Signed)
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 Severe persistent asthma without complication - Spirometry looked normal today.  - We will not make any changes today, as it seems to be working fairly well (9 months without prednisone). - Daily controller medication(s): Symbicort 160/4.5 two puffs twice daily + Singulair 10mg  daily + Fasenra every two months - Rescue medications: ProAir 4 puffs every 4-6 hours as needed, albuterol nebulizer one vial puffs every 4-6 hours as needed or DuoNeb nebulizer one vial every 4-6 hours as needed - Asthma control goals:  * Full participation in all desired activities (may need albuterol before activity) * Albuterol use two time or less a week on average (not counting use with activity) * Cough interfering with sleep two time or less a month * Oral steroids no more than once a year * No hospitalizations  2. Chronic allergic rhinitis - Continue with allergy shots at the same schedule.  - Continue with Nasonex. - Continue with nasal saline rinses daily  3. Acute sinusitis - With your current symptoms and time course, antibiotics are needed. - Start Augmentin 875mg  twice daily for two weeks.  - Add on nasal saline spray (i.e., Simply Saline) or nasal saline lavage (i.e., NeilMed) as needed prior to medicated nasal sprays. - For thick post nasal drainage, add guaifenesin [phone] mg (Mucinex)  twice daily as needed with adequate hydration as discussed.  4. Return in about 6 months (around 12/05/2017).  Subjective:   Bridget Bell is a 56 y.o. female presenting today for follow up  of  Chief Complaint  Patient presents with  . Cough    x 3 weeks    Bridget BathRonda Bell has a history of the following: Patient Active Problem List   Diagnosis Date Noted  . Recurrent infections 02/15/2017  . Seasonal and perennial allergic rhinitis 02/15/2017  . Severe persistent asthma without complication 07/02/2016  . Acute non-recurrent maxillary sinusitis 07/02/2016  . Essential hypertension 07/02/2016  . Epistaxis 02/29/2016  . Moderate persistent asthma 08/23/2015  . Gastroesophageal reflux disease without esophagitis 08/23/2015  . Allergic rhinitis due to pollen 05/20/2015    History obtained from: chart review and paient.  [facility] Primary Care Provider is Molinda BailiffSmith, Ernest, [place].     Bridget HoitRonda is a 56 y.o. female presenting for a follow up visit. She was last seen in July 2018. At that time, she was actually doing fairly well. Her spirometry looked normal and we continued her on Symbicort 160/4.5 two puffs BID as well as Singulair and Fasenra. She has now spaced out to every 8 weeks on the [place]asenra. She has a history of grass and tree pollen allergy, and is on immunotherapy for this. I diagnosed her with bronchitis, which we treated with doxycycline as well as Tussionex. We obtained Pneumococcal titers, which had robust protection.   Since the last visit, she has mostly done well. She remains on her Symbicort and Fasenra, and does well with this for the most part. However she has been coughing for three weeks without relief. She has been using her rescue medication, which does provide some minimal relief for around two hours at the most. She has remained  afebrile and has not had to miss any work for her symptoms (she works as a Midwifekindergarten teacher). She has tried using her leftover Tussionex, which did not provide much relief.   It should be noted that her last prednisone course was in February 2018. Harrington ChallengerFasenra was started in May 2018, and she has remained steroid free for nine months at this  point, which is relatively unheard of for her. She did get a course of doxycycline in July 2018, otherwise she has not needed antibiotics in quite some time.  Allergy shots are going well. She is in her Red Vial and has now been spaced to every 3 weeks. She denies reactions and feels that they are working well.  Otherwise, there have been no changes to her past medical history, surgical history, family history, or social history.    Review of Systems: a 14-point review of systems is pertinent for what is mentioned in HPI.  Otherwise, all other systems were negative. Constitutional: negative other than that listed in the HPI Eyes: negative other than that listed in the HPI Ears, nose, mouth, throat, and face: negative other than that listed in the HPI Respiratory: negative other than that listed in the HPI Cardiovascular: negative other than that listed in the HPI Gastrointestinal: negative other than that listed in the HPI Genitourinary: negative other than that listed in the HPI Integument: negative other than that listed in the HPI Hematologic: negative other than that listed in the HPI Musculoskeletal: negative other than that listed in the HPI Neurological: negative other than that listed in the HPI Allergy/Immunologic: negative other than that listed in the HPI    Objective:   Blood pressure 122/80, pulse 99, temperature 98.1 F (36.7 C), temperature source Oral, resp. rate 18, SpO2 97 %. There is no height or weight on file to calculate BMI.   Physical Exam:  General: Alert, interactive, in no acute distress. Very pleasant and well put together, as always.  Eyes: No conjunctival injection bilaterally, no discharge on the right, no discharge on the left and no Horner-Trantas dots present. PERRL bilaterally. EOMI without pain. No photophobia.  Ears: Right TM pearly gray with normal light reflex, Left TM pearly gray with normal light reflex, Right TM intact without perforation and  Left TM intact without perforation.  Nose/Throat: External nose within normal limits and septum midline. Turbinates edematous with thick discharge. Posterior oropharynx erythematous with cobblestoning in the posterior oropharynx. Tonsils 2+ without exudates.  Tongue without thrush. Bilateral maxillary sinus pressure present.  Adenopathy: no enlarged lymph nodes appreciated in the anterior cervical, occipital, axillary, epitrochlear, inguinal, or popliteal regions. Lungs: Clear to auscultation without wheezing, rhonchi or rales. No increased work of breathing. CV: Normal S1/S2. No murmurs. Capillary refill <2 seconds.  Skin: Warm and dry, without lesions or rashes. Neuro:   Grossly intact. No focal deficits appreciated. Responsive to questions.  Diagnostic studies:   Spirometry: results normal (FEV1: 2.40/91%, FVC: 3.09/91%, FEV1/FVC: 78%).    Spirometry consistent with normal pattern.   Allergy Studies: none    Malachi BondsJoel Maanav Kassabian, MD [facility] [place]

## 2017-06-07 NOTE — Patient Instructions (Addendum)
1. Severe persistent asthma without complication - Spirometry looked normal today.  - We will not make any changes today, as it seems to be working fairly well (9 months without prednisone). - Daily controller medication(s): Symbicort 160/4.5 two puffs twice daily + Singulair 10mg  daily + Fasenra every two months - Rescue medications: ProAir 4 puffs every 4-6 hours as needed, albuterol nebulizer one vial puffs every 4-6 hours as needed or DuoNeb nebulizer one vial every 4-6 hours as needed - Asthma control goals:  * Full participation in all desired activities (may need albuterol before activity) * Albuterol use two time or less a week on average (not counting use with activity) * Cough interfering with sleep two time or less a month * Oral steroids no more than once a year * No hospitalizations  2. Chronic allergic rhinitis - Continue with allergy shots at the same schedule.  - Continue with Nasonex. - Continue with nasal saline rinses daily  3. Acute sinusitis - With your current symptoms and time course, antibiotics are needed.  - Add on nasal saline spray (i.e., Simply Saline) or nasal saline lavage (i.e., NeilMed) as needed prior to medicated nasal sprays. - For thick post nasal drainage, add guaifenesin [phone] mg (Mucinex)  twice daily as needed with adequate hydration as discussed.  4. Return in about 6 months (around 12/05/2017).   Please inform us of any Emergency Department visits, hospitalizations, or changes in symptoms. Call us before going to the ED for breathing or allergy symptoms since we might be able to fit you in for a sick visit. Feel free to contact us anytime with any questions, problems, or concerns.  It was a pleasure to see you again today! Enjoy the Thanksgiving season!  Websites that have reliable patient information: 1. American Academy of Asthma, Allergy, and Immunology: www.aaaai.org 2. Food Allergy Research and Education (FARE): foodallergy.org 3.  Mothers of Asthmatics: http://www.asthmacommunitynetwork.org 4. American College of Allergy, Asthma, and Immunology: www.acaai.org

## 2017-06-08 ENCOUNTER — Encounter: Payer: Self-pay | Admitting: Allergy & Immunology

## 2017-06-24 ENCOUNTER — Telehealth: Payer: Self-pay

## 2017-06-24 NOTE — Telephone Encounter (Signed)
Patient wanted to get a warm steam vaporizer to run at night to help with her coughing during the night.  She also can add camphor into the machine. Please advise.   Spoke with Dr. Beaulah DinningBardelas.  He DOES NOT RECOMMEND a warm steam vaporizer.  Notified patient.

## 2017-06-27 ENCOUNTER — Encounter: Payer: Self-pay | Admitting: Allergy and Immunology

## 2017-06-27 ENCOUNTER — Ambulatory Visit (INDEPENDENT_AMBULATORY_CARE_PROVIDER_SITE_OTHER): Payer: BLUE CROSS/BLUE SHIELD | Admitting: Allergy and Immunology

## 2017-06-27 VITALS — BP 134/88 | HR 96 | Temp 98.3°F | Resp 16

## 2017-06-27 DIAGNOSIS — J3089 Other allergic rhinitis: Secondary | ICD-10-CM | POA: Diagnosis not present

## 2017-06-27 DIAGNOSIS — J4551 Severe persistent asthma with (acute) exacerbation: Secondary | ICD-10-CM

## 2017-06-27 DIAGNOSIS — J0100 Acute maxillary sinusitis, unspecified: Secondary | ICD-10-CM | POA: Diagnosis not present

## 2017-06-27 DIAGNOSIS — K219 Gastro-esophageal reflux disease without esophagitis: Secondary | ICD-10-CM

## 2017-06-27 DIAGNOSIS — R040 Epistaxis: Secondary | ICD-10-CM | POA: Diagnosis not present

## 2017-06-27 DIAGNOSIS — J302 Other seasonal allergic rhinitis: Secondary | ICD-10-CM | POA: Diagnosis not present

## 2017-06-27 MED ORDER — FLUTICASONE PROPIONATE HFA 110 MCG/ACT IN AERO: 2.0000 | INHALATION_SPRAY | Freq: Two times a day (BID) | RESPIRATORY_TRACT | 5 refills | Status: DC

## 2017-06-27 MED ORDER — AZELASTINE HCL 0.1 % NA SOLN: 1.0000 | Freq: Two times a day (BID) | NASAL | 5 refills | Status: DC | PRN

## 2017-06-27 MED ORDER — PREDNISONE 1 MG PO TABS: 10.0000 mg | ORAL_TABLET | Freq: Every day | ORAL | Status: AC

## 2017-06-27 MED ORDER — CARBINOXAMINE MALEATE 6 MG PO TABS: 6.0000 mL | ORAL_TABLET | Freq: Three times a day (TID) | ORAL | 5 refills | Status: DC | PRN

## 2017-06-27 NOTE — Assessment & Plan Note (Signed)
   Continue Fasenra injections every 2 months, Symbicort 160-4.5 g, 2 inhalations via spacer device twice daily, montelukast 10 mg daily bedtime, and albuterol HFA, 1-2 inhalations every 4-6 hours as needed.  During respiratory tract infections or asthma flares, add Flovent 110g 2 inhalations 2 times per day until symptoms have returned to baseline.  If symptoms persist or progress over the next few days despite treatment plan as outlined above prednisone has been provided, 40 mg x3 days, 20 mg x1 day, 10 mg x1 day, then stop.  The patient has been asked to contact our office if her symptoms persist or progress. Otherwise, she may return for follow up in 4 months.

## 2017-06-27 NOTE — Assessment & Plan Note (Signed)
   A prescription has been provided for azelastine nasal spray, 1-2 sprays per nostril 2 times daily as needed. Proper nasal spray technique has been discussed and demonstrated.   Discontinue Nasonex.  Nasal saline spray (i.e., Simply Saline) or nasal saline lavage (i.e., NeilMed) is recommended as needed and prior to medicated nasal sprays.  A prescription has been provided for RyVent (carbinoxamine maleate) 6mg  every 6-8 hours as needed.

## 2017-06-27 NOTE — Assessment & Plan Note (Signed)
   Proper technique for stanching epistaxis has been discussed and demonstrated.  Nasal saline spray and/or nasal saline gel is recommended to moisturize nasal mucosa.  During epistaxis, if needed, oxymetazoline (Afrin) nasal sparay may be applied to a cotton ball to help stanch the blood flow.  If this problem persists or progresses, otolaryngology evaluation may be warranted.

## 2017-06-27 NOTE — Progress Notes (Signed)
Follow-up Note  RE: Bridget Bell DOB: Dec 13, 1960 Date of Office Visit: 06/27/2017  Primary care provider: Molinda BailiffSmith, Ernest, PA Referring provider: Molinda BailiffSmith, Ernest, PA  History of present illness: Bridget Bell is a 56 y.o. female with severe persistent asthma, allergic rhinitis, gastroesophageal reflux presenting today for sick visit.  She was last seen in this clinic on November 16 by Dr. Dellis AnesGallagher.  She reports that over the past 6 weeks she has experienced a severe, persistent cough.  She perceives that the cough originates from deep within her chest.  She was prescribed a course of Augmentin and prednisone by Dr. Dellis AnesGallagher.  She does not believe that the Augmentin provided any benefit, but states that her cough improved while on the prednisone.  However, the cough "came right back as soon as the prednisone left (her) system."  She is currently taking Symbicort 160-4.5 g, 2 inhalations via spacer device twice daily, and montelukast 10 mg daily at bedtime.  She believes that she has had 4 rounds of benralizumab and thinks that she has noticed improvement overall while on this medication. Bridget Bell complains of recent episodes of epistaxis.  She reports that this morning she had a significant nosebleed lasting for almost 20 minutes.  The blood typically flows from the right nostril.   Assessment and plan: Severe persistent asthma with exacerbation  Continue Fasenra injections every 2 months, Symbicort 160-4.5 g, 2 inhalations via spacer device twice daily, montelukast 10 mg daily bedtime, and albuterol HFA, 1-2 inhalations every 4-6 hours as needed.  During respiratory tract infections or asthma flares, add Flovent 110g 2 inhalations 2 times per day until symptoms have returned to baseline.  If symptoms persist or progress over the next few days despite treatment plan as outlined above prednisone has been provided, 40 mg x3 days, 20 mg x1 day, 10 mg x1 day, then stop.  The patient  has been asked to contact our office if her symptoms persist or progress. Otherwise, she may return for follow up in 4 months.  Seasonal and perennial allergic rhinitis  A prescription has been provided for azelastine nasal spray, 1-2 sprays per nostril 2 times daily as needed. Proper nasal spray technique has been discussed and demonstrated.   Discontinue Nasonex.  Nasal saline spray (i.e., Simply Saline) or nasal saline lavage (i.e., NeilMed) is recommended as needed and prior to medicated nasal sprays.  A prescription has been provided for RyVent (carbinoxamine maleate) 6mg  every 6-8 hours as needed.  Epistaxis  Proper technique for stanching epistaxis has been discussed and demonstrated.  Nasal saline spray and/or nasal saline gel is recommended to moisturize nasal mucosa.  During epistaxis, if needed, oxymetazoline (Afrin) nasal sparay may be applied to a cotton ball to help stanch the blood flow.  If this problem persists or progresses, otolaryngology evaluation may be warranted.    Meds ordered this encounter  Medications  . fluticasone (FLOVENT HFA) 110 MCG/ACT inhaler    Sig: Inhale 2 puffs into the lungs 2 (two) times daily. Use during asthma flares.    Dispense:  1 Inhaler    Refill:  5  . azelastine (ASTELIN) 0.1 % nasal spray    Sig: Place 1-2 sprays into both nostrils 2 (two) times daily as needed for rhinitis. Use in each nostril as directed    Dispense:  30 mL    Refill:  5  . Carbinoxamine Maleate (RYVENT) 6 MG TABS    Sig: Take 6 mLs by mouth every 8 (eight)  hours as needed.    Dispense:  120 tablet    Refill:  5  . predniSONE (DELTASONE) tablet 10 mg    Diagnostics: Spirometry:  Normal with an FEV1 of 99% predicted.  Please see scanned spirometry results for details.    Physical examination: Blood pressure 134/88, pulse 96, temperature 98.3 F (36.8 C), temperature source Oral, resp. rate 16, SpO2 95 %.  General: Alert, interactive, in no acute  distress. HEENT: TMs pearly gray, turbinates moderately edematous with thick discharge, post-pharynx moderately erythematous. Neck: Supple without lymphadenopathy. Lungs: Clear to auscultation without wheezing, rhonchi or rales. CV: Normal S1, S2 without murmurs. Skin: Warm and dry, without lesions or rashes.  The following portions of the patient's history were reviewed and updated as appropriate: allergies, current medications, past family history, past medical history, past social history, past surgical history and problem list.  Allergies as of 06/27/2017      Reactions   Amoxicillin-pot Clavulanate Other (See Comments)   Real bad yeast infection      Medication List        Accurate as of 06/27/17  8:45 PM. Always use your most recent med list.          acetaminophen 650 MG CR tablet Commonly known as:  TYLENOL Take 1,300 mg by mouth daily.   albuterol 108 (90 Base) MCG/ACT inhaler Commonly known as:  PROVENTIL HFA Inhale 2 puffs into the lungs every 4 (four) hours as needed.   albuterol (2.5 MG/3ML) 0.083% nebulizer solution Commonly known as:  PROVENTIL USE ONE VIAL IN NEBULIZER EVERY 4 TO 6 HOURS AS NEEDED FOR COUGH OR WHEEZE   amLODipine 10 MG tablet Commonly known as:  NORVASC TAKE 1 TABLET BY MOUTH EVERY DAY   azelastine 0.1 % nasal spray Commonly known as:  ASTELIN Place 1-2 sprays into both nostrils 2 (two) times daily as needed for rhinitis. Use in each nostril as directed   CALCIUM+D3 600-800 MG-UNIT Tabs Generic drug:  Calcium Carb-Cholecalciferol Take by mouth daily.   Carbinoxamine Maleate 6 MG Tabs Commonly known as:  RYVENT Take 6 mLs by mouth every 8 (eight) hours as needed.   CENTRUM SILVER ULTRA WOMENS PO Take by mouth daily.   MULTIVITAMIN WOMEN 50+ PO Take by mouth.   CINNAMON PLUS CHROMIUM PO Take 1,000 mg by mouth 2 (two) times daily.   cyclobenzaprine 10 MG tablet Commonly known as:  FLEXERIL Take 10 mg by mouth.     DOCOSAHEXAENOIC ACID PO Take by mouth.   doxycycline 100 MG tablet Commonly known as:  VIBRA-TABS One tablet by mouth twice daily for 14 days for infection.   ECHINACEA PO Take 760 mg by mouth daily.   EPINEPHrine 0.3 mg/0.3 mL Soaj injection Commonly known as:  EPIPEN 2-PAK Use as directed for severe allergic reactions   ESTER C PO Take 1,000 mg by mouth daily.   estradiol 0.5 MG tablet Commonly known as:  ESTRACE Take 0.5 mg by mouth.   FASENRA 30 MG/ML Sosy Generic drug:  Benralizumab Inject into the skin.   fexofenadine 180 MG tablet Commonly known as:  ALLEGRA Take 180 mg by mouth daily.   MUCINEX ALLERGY PO Take 400 mg by mouth 2 (two) times daily. Sometimes patient takes it 3 times daily.   fluticasone 110 MCG/ACT inhaler Commonly known as:  FLOVENT HFA Inhale 2 puffs into the lungs 2 (two) times daily. Use during asthma flares.   gabapentin 300 MG capsule Commonly known as:  NEURONTIN Take  600 mg by mouth.   Ginger Root 550 MG Caps Take 550 mg by mouth daily.   GLUCOSAMINE CHONDROITIN MSM PO Take 1,500 mg by mouth daily.   hydrochlorothiazide 25 MG tablet Commonly known as:  HYDRODIURIL TAKE ONE TABLET BY MOUTH ONCE DAILY   KOREAN GINSENG PO Take 500 mg by mouth daily. Reported on 09/02/2015   losartan-hydrochlorothiazide 100-25 MG tablet Commonly known as:  HYZAAR TAKE 1 TABLET BY MOUTH EVERY DAY   Magnesium 400 MG Caps Take by mouth daily.   MAXIMUM RED KRILL PO Take 1,000 mg by mouth daily.   meloxicam 15 MG tablet Commonly known as:  MOBIC Take 15 mg by mouth.   mometasone 50 MCG/ACT nasal spray Commonly known as:  NASONEX Place 2 sprays into the nose daily.   montelukast 10 MG tablet Commonly known as:  SINGULAIR Take 1 tablet (10 mg total) by mouth at bedtime.   NONFORMULARY OR COMPOUNDED ITEM   optichamber diamond Misc USE AS DIRECTED.   PAPAYA ENZYME PO Take 66 mg by mouth daily.   potassium chloride 10 MEQ  tablet Commonly known as:  K-DUR,KLOR-CON TAKE ONE TABLET BY MOUTH EVERY DAY   predniSONE 10 MG tablet Commonly known as:  DELTASONE Take one 10mg  tablet twice a day for four days and then take one 10mg  on day five. Take with food.   ranitidine 150 MG tablet Commonly known as:  ZANTAC Take 150 mg by mouth daily.   SM IRON 325 (65 FE) MG tablet Generic drug:  ferrous sulfate Take 325 mg by mouth.   SYMBICORT 160-4.5 MCG/ACT inhaler Generic drug:  budesonide-formoterol Inhale 2 puffs into the lungs 2 (two) times daily.       Allergies  Allergen Reactions  . Amoxicillin-Pot Clavulanate Other (See Comments)    Real bad yeast infection   Review of systems: Review of systems negative except as noted in HPI / PMHx or noted below: Constitutional: Negative.  HENT: Negative.   Eyes: Negative.  Respiratory: Negative.   Cardiovascular: Negative.  Gastrointestinal: Negative.  Genitourinary: Negative.  Musculoskeletal: Negative.  Neurological: Negative.  Endo/Heme/Allergies: Negative.  Cutaneous: Negative.  Past Medical History:  Diagnosis Date  . Arthritis   . Asthma   . Hypertension     Family History  Problem Relation Age of Onset  . Allergic rhinitis Mother   . Asthma Mother   . Angioedema Neg Hx   . Atopy Neg Hx   . Eczema Neg Hx   . Urticaria Neg Hx   . Immunodeficiency Neg Hx     Social History   Socioeconomic History  . Marital status: Married    Spouse name: Not on file  . Number of children: Not on file  . Years of education: Not on file  . Highest education level: Not on file  Social Needs  . Financial resource strain: Not on file  . Food insecurity - worry: Not on file  . Food insecurity - inability: Not on file  . Transportation needs - medical: Not on file  . Transportation needs - non-medical: Not on file  Occupational History  . Not on file  Tobacco Use  . Smoking status: Never Smoker  . Smokeless tobacco: Never Used  Substance and  Sexual Activity  . Alcohol use: No    Alcohol/week: 0.0 oz  . Drug use: No  . Sexual activity: Not on file  Other Topics Concern  . Not on file  Social History Narrative  . Not on  file    I appreciate the opportunity to take part in Leeasia's care. Please do not hesitate to contact me with questions.  Sincerely,   R. Jorene Guest, MD

## 2017-06-27 NOTE — Patient Instructions (Addendum)
Severe persistent asthma with exacerbation  Continue Fasenra injections every 2 months, Symbicort 160-4.5 g, 2 inhalations via spacer device twice daily, montelukast 10 mg daily bedtime, and albuterol HFA, 1-2 inhalations every 4-6 hours as needed.  During respiratory tract infections or asthma flares, add Flovent 110g 2 inhalations 2 times per day until symptoms have returned to baseline.  If symptoms persist or progress over the next few days despite treatment plan as outlined above prednisone has been provided, 40 mg x3 days, 20 mg x1 day, 10 mg x1 day, then stop.  The patient has been asked to contact our office if her symptoms persist or progress. Otherwise, she may return for follow up in 4 months.  Seasonal and perennial allergic rhinitis  A prescription has been provided for azelastine nasal spray, 1-2 sprays per nostril 2 times daily as needed. Proper nasal spray technique has been discussed and demonstrated.   Discontinue Nasonex.  Nasal saline spray (i.e., Simply Saline) or nasal saline lavage (i.e., NeilMed) is recommended as needed and prior to medicated nasal sprays.  A prescription has been provided for RyVent (carbinoxamine maleate) 6mg  every 6-8 hours as needed.  Epistaxis  Proper technique for stanching epistaxis has been discussed and demonstrated.  Nasal saline spray and/or nasal saline gel is recommended to moisturize nasal mucosa.  During epistaxis, if needed, oxymetazoline (Afrin) nasal sparay may be applied to a cotton ball to help stanch the blood flow.  If this problem persists or progresses, otolaryngology evaluation may be warranted.    Return in about 4 months (around 10/26/2017), or if symptoms worsen or fail to improve.

## 2017-07-03 ENCOUNTER — Ambulatory Visit (INDEPENDENT_AMBULATORY_CARE_PROVIDER_SITE_OTHER): Payer: BLUE CROSS/BLUE SHIELD | Admitting: *Deleted

## 2017-07-03 DIAGNOSIS — J4550 Severe persistent asthma, uncomplicated: Secondary | ICD-10-CM | POA: Diagnosis not present

## 2017-08-05 ENCOUNTER — Other Ambulatory Visit: Payer: Self-pay | Admitting: Allergy

## 2017-08-05 ENCOUNTER — Telehealth: Payer: Self-pay | Admitting: Allergy

## 2017-08-05 MED ORDER — PREDNISONE 10 MG (21) PO TBPK: ORAL_TABLET | ORAL | 0 refills | Status: DC

## 2017-08-05 NOTE — Telephone Encounter (Signed)
Called in prednisone and  Informed patient.

## 2017-08-05 NOTE — Telephone Encounter (Signed)
Pt called and said she was coughing and wheezing.Having to sit up at night to sleep.No fever. Coughing up little yellow. Nose is clear. [facility] [address] [place]. Pt. Phone number is [phone]

## 2017-08-05 NOTE — Telephone Encounter (Signed)
Please provide prednisone, 20 mg x 4 days, 10 mg x1 day, then stop.  Symptoms persist or progress have her come in for an office visit.  Thanks.

## 2017-08-06 ENCOUNTER — Other Ambulatory Visit: Payer: Self-pay | Admitting: Allergy

## 2017-08-06 ENCOUNTER — Telehealth: Payer: Self-pay | Admitting: Allergy

## 2017-08-06 MED ORDER — PREDNISONE 10 MG PO TABS: ORAL_TABLET | ORAL | 0 refills | Status: DC

## 2017-08-06 NOTE — Telephone Encounter (Signed)
Informed patient prednisone has been called in.

## 2017-08-09 ENCOUNTER — Telehealth: Payer: Self-pay | Admitting: Allergy

## 2017-08-09 MED ORDER — ALBUTEROL SULFATE (2.5 MG/3ML) 0.083% IN NEBU: INHALATION_SOLUTION | RESPIRATORY_TRACT | 0 refills | Status: DC

## 2017-08-09 NOTE — Telephone Encounter (Signed)
Patient called stating she had run out of her albuterol for her nebulizer and would like a refill before she comes in for her appointment on Monday to get through the weekend.  [facility] [place].

## 2017-08-12 ENCOUNTER — Encounter: Payer: Self-pay | Admitting: Allergy and Immunology

## 2017-08-12 ENCOUNTER — Ambulatory Visit: Payer: BLUE CROSS/BLUE SHIELD | Admitting: Allergy and Immunology

## 2017-08-12 VITALS — BP 118/70 | HR 95 | Resp 17 | Wt 209.2 lb

## 2017-08-12 DIAGNOSIS — K219 Gastro-esophageal reflux disease without esophagitis: Secondary | ICD-10-CM | POA: Diagnosis not present

## 2017-08-12 DIAGNOSIS — J4550 Severe persistent asthma, uncomplicated: Secondary | ICD-10-CM | POA: Diagnosis not present

## 2017-08-12 DIAGNOSIS — R040 Epistaxis: Secondary | ICD-10-CM | POA: Diagnosis not present

## 2017-08-12 DIAGNOSIS — J0100 Acute maxillary sinusitis, unspecified: Secondary | ICD-10-CM

## 2017-08-12 MED ORDER — DOXYCYCLINE HYCLATE 100 MG PO TABS
100.0000 mg | ORAL_TABLET | Freq: Two times a day (BID) | ORAL | 0 refills | Status: DC
Start: 2017-08-12 — End: 2019-01-02

## 2017-08-12 NOTE — Assessment & Plan Note (Signed)
   Prednisone has been provided, 40 mg x3 days, 20 mg x1 day, 10 mg x1 day, then stop.  A prescription has been provided for doxycycline 100 mg twice daily times 7 days.  Continue nasal saline lavage followed by azelastine nasal spray twice daily.  Restart RyVent (carbinoxamine) 6 mg every 8 hours if needed.

## 2017-08-12 NOTE — Progress Notes (Signed)
Follow-up Note  RE: Bridget Bell MRN: [id number] DOB: [birth year] Date of Office Visit: 08/12/2017  Primary care provider: Molinda Bailiff, PA Referring provider: Molinda Bailiff, PA  History of present illness: Bridget Bell is a 57 y.o. female with severe persistent asthma, allergic rhinitis, gastroesophageal reflux presenting today for a sick visit.  She was last seen in this clinic June 27, 2017.  Reports that over the past couple/few weeks she has been experiencing progressive sinus pressure between the eyes copious thick postnasal drainage.  She has had to sleep propped up at night because of the drainage.  In addition, she has been experiencing increased coughing, dyspnea, and wheezing.  She reports the cough has been productive of discolored green mucus.  At times she coughs to the point of vomiting.  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  She has no reflux related complaints today.   Assessment and plan: Acute sinusitis  Prednisone has been provided, 40 mg x3 days, 20 mg x1 day, 10 mg x1 day, then stop.  A prescription has been provided for doxycycline 100 mg twice daily times 7 days.  Continue nasal saline lavage followed by azelastine nasal spray twice daily.  Restart RyVent (carbinoxamine) 6 mg every 8 hours if needed.  Severe persistent asthma without complication  Prednisone has been provided (as above).  Continue Fasenra injections every 2 months, Symbicort 160-4.5 g, 2 inhalations via spacer device twice daily, montelukast 10 mg daily bedtime, and albuterol HFA, 1-2 inhalations every 4-6 hours as needed.  During respiratory tract infections or asthma flares, add Flovent 110g 2 inhalations 2 times per day until symptoms have returned to baseline.  The  patient has been asked to contact our office if her symptoms persist or progress. Otherwise, she may return for follow up in 4 months.   Meds ordered this encounter  Medications  . doxycycline (VIBRA-TABS) 100 MG tablet    Sig: Take 1 tablet (100 mg total) by mouth 2 (two) times daily.    Dispense:  14 tablet    Refill:  0    Diagnostics: Prematurity reveals an FVC of 3.50 L and an FEV1 of 2.70 L.  This study was performed while the patient was asymptomatic.  Please see scanned spirometry results for details.    Physical examination: Blood pressure 118/70, pulse 95, resp. rate 17, weight 209 lb 3.2 oz (94.9 kg), SpO2 98 %.  General: Alert, interactive, in no acute distress. HEENT: TMs pearly gray, turbinates edematous with thick discharge, post-pharynx erythematous. Neck: Supple without lymphadenopathy. Lungs: Clear to auscultation without wheezing, rhonchi or rales. CV: Normal S1, S2 without murmurs. Skin: Warm and dry, without lesions or rashes.  The following portions of the patient's history were reviewed and updated as appropriate: allergies, current medications, past family history, past medical history, past social history, past surgical history and problem list.  Allergies as of 08/12/2017      Reactions   Amoxicillin-pot Clavulanate Other (See Comments)   Real bad yeast infection      Medication List        Accurate as of 08/12/17  6:52 PM. Always use your most recent med list.          acetaminophen 650 MG CR tablet Commonly known as:  TYLENOL Take 1,300 mg  by mouth daily.   albuterol 108 (90 Base) MCG/ACT inhaler Commonly known as:  PROVENTIL HFA Inhale 2 puffs into the lungs every 4 (four) hours as needed.   albuterol (2.5 MG/3ML) 0.083% nebulizer solution Commonly known as:  PROVENTIL USE ONE VIAL IN NEBULIZER EVERY 4 TO 6 HOURS AS NEEDED FOR COUGH OR WHEEZE   amLODipine 10 MG tablet Commonly known as:  NORVASC TAKE 1 TABLET BY MOUTH EVERY DAY     azelastine 0.1 % nasal spray Commonly known as:  ASTELIN Place 1-2 sprays into both nostrils 2 (two) times daily as needed for rhinitis. Use in each nostril as directed   CALCIUM+D3 600-800 MG-UNIT Tabs Generic drug:  Calcium Carb-Cholecalciferol Take by mouth daily.   CENTRUM SILVER ULTRA WOMENS PO Take by mouth daily.   MULTIVITAMIN WOMEN 50+ PO Take by mouth.   CINNAMON PLUS CHROMIUM PO Take 1,000 mg by mouth 2 (two) times daily.   cyclobenzaprine 10 MG tablet Commonly known as:  FLEXERIL Take 10 mg by mouth.   DOCOSAHEXAENOIC ACID PO Take by mouth.   doxycycline 100 MG tablet Commonly known as:  VIBRA-TABS Take 1 tablet (100 mg total) by mouth 2 (two) times daily.   ECHINACEA PO Take 760 mg by mouth daily.   EPINEPHrine 0.3 mg/0.3 mL Soaj injection Commonly known as:  EPIPEN 2-PAK Use as directed for severe allergic reactions   ESTER C PO Take 1,000 mg by mouth daily.   estradiol 0.5 MG tablet Commonly known as:  ESTRACE Take 0.5 mg by mouth.   FASENRA 30 MG/ML Sosy Generic drug:  Benralizumab Inject into the skin.   fexofenadine 180 MG tablet Commonly known as:  ALLEGRA Take 180 mg by mouth daily.   MUCINEX ALLERGY PO Take 400 mg by mouth 2 (two) times daily. Sometimes patient takes it 3 times daily.   fluticasone 110 MCG/ACT inhaler Commonly known as:  FLOVENT HFA Inhale 2 puffs into the lungs 2 (two) times daily. Use during asthma flares.   gabapentin 300 MG capsule Commonly known as:  NEURONTIN Take 600 mg by mouth.   Ginger Root 550 MG Caps Take 550 mg by mouth daily.   GLUCOSAMINE CHONDROITIN MSM PO Take 1,500 mg by mouth daily.   hydrochlorothiazide 25 MG tablet Commonly known as:  HYDRODIURIL TAKE ONE TABLET BY MOUTH ONCE DAILY   KOREAN GINSENG PO Take 500 mg by mouth daily. Reported on 09/02/2015   losartan-hydrochlorothiazide 100-25 MG tablet Commonly known as:  HYZAAR TAKE 1 TABLET BY MOUTH EVERY DAY   Magnesium 400 MG  Caps Take by mouth daily.   MAXIMUM RED KRILL PO Take 1,000 mg by mouth daily.   meloxicam 15 MG tablet Commonly known as:  MOBIC Take 15 mg by mouth.   mometasone 50 MCG/ACT nasal spray Commonly known as:  NASONEX Place 2 sprays into the nose daily.   montelukast 10 MG tablet Commonly known as:  SINGULAIR Take 1 tablet (10 mg total) by mouth at bedtime.   NONFORMULARY OR COMPOUNDED ITEM   optichamber diamond Misc USE AS DIRECTED.   PAPAYA ENZYME PO Take 66 mg by mouth daily.   potassium chloride 10 MEQ tablet Commonly known as:  K-DUR,KLOR-CON TAKE ONE TABLET BY MOUTH EVERY DAY   ranitidine 150 MG tablet Commonly known as:  ZANTAC Take 150 mg by mouth daily.   SM IRON 325 (65 FE) MG tablet Generic drug:  ferrous sulfate Take 325 mg by mouth.   SYMBICORT 160-4.5 MCG/ACT inhaler Generic  drug:  budesonide-formoterol Inhale 2 puffs into the lungs 2 (two) times daily.       Allergies  Allergen Reactions  . Amoxicillin-Pot Clavulanate Other (See Comments)    Real bad yeast infection   Review of systems: Review of systems negative except as noted in HPI / PMHx or noted below: Constitutional: Negative.  HENT: Negative.   Eyes: Negative.  Respiratory: Negative.   Cardiovascular: Negative.  Gastrointestinal: Negative.  Genitourinary: Negative.  Musculoskeletal: Negative.  Neurological: Negative.  Endo/Heme/Allergies: Negative.  Cutaneous: Negative.  Past Medical History:  Diagnosis Date  . Arthritis   . Asthma   . Hypertension     Family History  Problem Relation Age of Onset  . Allergic rhinitis Mother   . Asthma Mother   . Angioedema Neg Hx   . Atopy Neg Hx   . Eczema Neg Hx   . Urticaria Neg Hx   . Immunodeficiency Neg Hx     Social History   Socioeconomic History  . Marital status: Married    Spouse name: Not on file  . Number of children: Not on file  . Years of education: Not on file  . Highest education level: Not on file  Social  Needs  . Financial resource strain: Not on file  . Food insecurity - worry: Not on file  . Food insecurity - inability: Not on file  . Transportation needs - medical: Not on file  . Transportation needs - non-medical: Not on file  Occupational History  . Not on file  Tobacco Use  . Smoking status: Never Smoker  . Smokeless tobacco: Never Used  Substance and Sexual Activity  . Alcohol use: No    Alcohol/week: 0.0 oz  . Drug use: No  . Sexual activity: Not on file  Other Topics Concern  . Not on file  Social History Narrative  . Not on file    I appreciate the opportunity to take part in Jaelyne's care. Please do not hesitate to contact me with questions.  Sincerely,   R. Jorene Guestarter Virgel Haro, MD

## 2017-08-12 NOTE — Assessment & Plan Note (Signed)
   Prednisone has been provided (as above).  Continue Fasenra injections every 2 months, Symbicort 160-4.5 g, 2 inhalations via spacer device twice daily, montelukast 10 mg daily bedtime, and albuterol HFA, 1-2 inhalations every 4-6 hours as needed.  During respiratory tract infections or asthma flares, add Flovent 110g 2 inhalations 2 times per day until symptoms have returned to baseline.  The patient has been asked to contact our office if her symptoms persist or progress. Otherwise, she may return for follow up in 4 months.

## 2017-08-12 NOTE — Patient Instructions (Addendum)
Acute sinusitis  Prednisone has been provided, 40 mg x3 days, 20 mg x1 day, 10 mg x1 day, then stop.  A prescription has been provided for doxycycline 100 mg twice daily times 7 days.  Continue nasal saline lavage followed by azelastine nasal spray twice daily.  Restart RyVent (carbinoxamine) 6 mg every 8 hours if needed.  Severe persistent asthma without complication  Prednisone has been provided (as above).  Continue Fasenra injections every 2 months, Symbicort 160-4.5 g, 2 inhalations via spacer device twice daily, montelukast 10 mg daily bedtime, and albuterol HFA, 1-2 inhalations every 4-6 hours as needed.  During respiratory tract infections or asthma flares, add Flovent 110g 2 inhalations 2 times per day until symptoms have returned to baseline.  The patient has been asked to contact our office if her symptoms persist or progress. Otherwise, she may return for follow up in 4 months.   Return in about 4 months (around 12/10/2017), or if symptoms worsen or fail to improve.

## 2017-08-28 ENCOUNTER — Ambulatory Visit (INDEPENDENT_AMBULATORY_CARE_PROVIDER_SITE_OTHER): Payer: BLUE CROSS/BLUE SHIELD

## 2017-08-28 DIAGNOSIS — J4550 Severe persistent asthma, uncomplicated: Secondary | ICD-10-CM | POA: Diagnosis not present

## 2017-09-11 ENCOUNTER — Other Ambulatory Visit: Payer: Self-pay

## 2017-09-11 DIAGNOSIS — J0100 Acute maxillary sinusitis, unspecified: Secondary | ICD-10-CM

## 2017-09-11 DIAGNOSIS — J302 Other seasonal allergic rhinitis: Secondary | ICD-10-CM

## 2017-09-11 DIAGNOSIS — J3089 Other allergic rhinitis: Principal | ICD-10-CM

## 2017-09-11 MED ORDER — SYMBICORT 160-4.5 MCG/ACT IN AERO: 2.0000 | INHALATION_SPRAY | Freq: Two times a day (BID) | RESPIRATORY_TRACT | 1 refills | Status: DC

## 2017-09-11 MED ORDER — AZELASTINE HCL 0.1 % NA SOLN: NASAL | 2 refills | Status: DC

## 2017-09-11 MED ORDER — EPINEPHRINE 0.3 MG/0.3ML IJ SOAJ: INTRAMUSCULAR | 3 refills | Status: DC

## 2017-09-11 MED ORDER — MONTELUKAST SODIUM 10 MG PO TABS: 10.0000 mg | ORAL_TABLET | Freq: Every day | ORAL | 2 refills | Status: DC

## 2017-09-11 NOTE — Telephone Encounter (Signed)
Patient called needing refills.  [facility].

## 2017-09-17 DIAGNOSIS — J301 Allergic rhinitis due to pollen: Secondary | ICD-10-CM | POA: Diagnosis not present

## 2017-09-17 NOTE — Progress Notes (Signed)
VIAL EXP 09-17-18 

## 2017-09-30 ENCOUNTER — Ambulatory Visit (INDEPENDENT_AMBULATORY_CARE_PROVIDER_SITE_OTHER): Payer: BLUE CROSS/BLUE SHIELD

## 2017-09-30 DIAGNOSIS — J309 Allergic rhinitis, unspecified: Secondary | ICD-10-CM

## 2017-09-30 NOTE — Progress Notes (Signed)
Immunotherapy   Patient Details  Name: Jacqulyn BathRonda Turlington MRN: [id number] Date of Birth: 1960/11/22  09/30/2017  Jacqulyn Bathonda Retzloff here to pick up Red 1:100 Donzetta Sprung( [place]) Following schedule: C  Frequency:1 time per week, @ .50 Q 2 weeks Epi-Pen:Epi-Pen Available  Consent signed and patient instructions given. No problems   Virl SonDamita Ludger Bones 09/30/2017, 4:13 PM

## 2017-10-23 ENCOUNTER — Ambulatory Visit (INDEPENDENT_AMBULATORY_CARE_PROVIDER_SITE_OTHER): Payer: BLUE CROSS/BLUE SHIELD

## 2017-10-23 DIAGNOSIS — J4550 Severe persistent asthma, uncomplicated: Secondary | ICD-10-CM | POA: Diagnosis not present

## 2017-11-18 ENCOUNTER — Other Ambulatory Visit: Payer: Self-pay | Admitting: *Deleted

## 2017-11-18 MED ORDER — BENRALIZUMAB 30 MG/ML ~~LOC~~ SOSY: 1.0000 | PREFILLED_SYRINGE | SUBCUTANEOUS | 6 refills | Status: DC

## 2017-11-18 NOTE — Telephone Encounter (Signed)
Faxed request for refill from [facility]

## 2017-12-13 ENCOUNTER — Other Ambulatory Visit: Payer: Self-pay

## 2017-12-13 MED ORDER — SYMBICORT 160-4.5 MCG/ACT IN AERO: 2.0000 | INHALATION_SPRAY | Freq: Two times a day (BID) | RESPIRATORY_TRACT | 1 refills | Status: DC

## 2017-12-13 NOTE — Telephone Encounter (Signed)
RF for Symbicort x 1 with 1 refill at [facility]

## 2017-12-18 ENCOUNTER — Ambulatory Visit (INDEPENDENT_AMBULATORY_CARE_PROVIDER_SITE_OTHER): Payer: BLUE CROSS/BLUE SHIELD | Admitting: *Deleted

## 2017-12-18 DIAGNOSIS — J4550 Severe persistent asthma, uncomplicated: Secondary | ICD-10-CM | POA: Diagnosis not present

## 2017-12-24 ENCOUNTER — Other Ambulatory Visit: Payer: Self-pay | Admitting: Allergy

## 2017-12-24 DIAGNOSIS — J4551 Severe persistent asthma with (acute) exacerbation: Secondary | ICD-10-CM

## 2017-12-24 MED ORDER — FLUTICASONE PROPIONATE HFA 110 MCG/ACT IN AERO
2.0000 | INHALATION_SPRAY | Freq: Two times a day (BID) | RESPIRATORY_TRACT | 1 refills | Status: DC
Start: 2017-12-24 — End: 2018-01-03

## 2018-01-03 ENCOUNTER — Other Ambulatory Visit: Payer: Self-pay | Admitting: Allergy and Immunology

## 2018-01-03 DIAGNOSIS — J4551 Severe persistent asthma with (acute) exacerbation: Secondary | ICD-10-CM

## 2018-01-03 MED ORDER — FLUTICASONE PROPIONATE HFA 110 MCG/ACT IN AERO: INHALATION_SPRAY | RESPIRATORY_TRACT | 0 refills | Status: DC

## 2018-01-03 MED ORDER — ALBUTEROL SULFATE HFA 108 (90 BASE) MCG/ACT IN AERS: 2.0000 | INHALATION_SPRAY | RESPIRATORY_TRACT | 0 refills | Status: DC | PRN

## 2018-01-03 NOTE — Telephone Encounter (Signed)
flovent 110 mcg and proventil hfa sent to out pt. Pharmacy.

## 2018-01-03 NOTE — Telephone Encounter (Addendum)
Spoke to pt. Her medicines have to be sent to an out pt. Pharmacy to be covered. Will fill the flovent 110 mcg and proventil hfa 90 day supply it is a lot cheaper for the pt. I scheduled an appointment for 06/27th at 10:00 for Thermon LeylandAnne Ambs NP.

## 2018-01-03 NOTE — Telephone Encounter (Signed)
Patient called requesting a refill on her Flovent. [facility] [place] She said that pharmacy is on file.

## 2018-01-16 ENCOUNTER — Encounter: Payer: Self-pay | Admitting: Family Medicine

## 2018-01-16 ENCOUNTER — Ambulatory Visit: Payer: BLUE CROSS/BLUE SHIELD | Admitting: Family Medicine

## 2018-01-16 VITALS — BP 126/90 | HR 88 | Temp 98.0°F | Resp 12

## 2018-01-16 DIAGNOSIS — J3089 Other allergic rhinitis: Secondary | ICD-10-CM

## 2018-01-16 DIAGNOSIS — K219 Gastro-esophageal reflux disease without esophagitis: Secondary | ICD-10-CM

## 2018-01-16 DIAGNOSIS — J302 Other seasonal allergic rhinitis: Secondary | ICD-10-CM | POA: Diagnosis not present

## 2018-01-16 DIAGNOSIS — R490 Dysphonia: Secondary | ICD-10-CM | POA: Diagnosis not present

## 2018-01-16 DIAGNOSIS — J4550 Severe persistent asthma, uncomplicated: Secondary | ICD-10-CM | POA: Diagnosis not present

## 2018-01-16 MED ORDER — OMEPRAZOLE 20 MG PO CPDR
20.0000 mg | DELAYED_RELEASE_CAPSULE | Freq: Every day | ORAL | 5 refills | Status: DC
Start: 2018-01-16 — End: 2018-01-16

## 2018-01-16 MED ORDER — ALBUTEROL SULFATE (2.5 MG/3ML) 0.083% IN NEBU: INHALATION_SOLUTION | RESPIRATORY_TRACT | 1 refills | Status: DC

## 2018-01-16 NOTE — Patient Instructions (Addendum)
Severe persistent asthma without complication  Continue Fasenra injections every 2 months  Continue Symbicort 160-4.5 g, 2 inhalations via spacer device twice daily  Continue montelukast 10 mg daily bedtime  Continue albuterol HFA, 1-2 inhalations every 4-6 hours as needed.  During respiratory tract infections or asthma flares, add Flovent 110g 2 inhalations 2 times per day until symptoms have returned to baseline. Reflux  Begin omeprazole 20 mg once a day  Stop ranitidine for now  Voice hoarseness  Continue nasal saline rinses  Continue azelastine nasal spray  Refer to ENT for further evaluation  Rhinitis  Continue Allegra once a day as needed for a runny nose  Consider nasal saline rinses   Continue the other medications as listed in the chart  Call me if this treatment plan is not working well for you  Follow up in 3 months or sooner if needed

## 2018-01-16 NOTE — Progress Notes (Addendum)
[address] [place] [id number] Dept: [phone]  FOLLOW UP NOTE  Patient ID: Bridget Bell    DOB: 02/18/1961  Age: 57 y.o. MRN: [id number] Date of Office Visit: 01/16/2018  Assessment  Chief Complaint: Asthma  HPI Bridget Bell who presents to the clinic for a follow up visit. She was last seen in this clinic on 08/12/2017 by Dr. Nunzio CobbsBobbitt for evaluation of asthma and acute sinusitis. At that time, she required prednisone and doxycycline for resolution of symptoms. She continued on her Fasenra injections, Symbicort 160, montelukast daily, and albuterol as needed.   At today's visit, she reports that she began to cough in the first week of May through the first week of June. She reports that the cough has cleared, however, she continues to experience voice hoarseness. She denies a sore throat. She reports that she has experienced this on prior occasions, however, the hoarseness cleared within a few days of the cough stopping. She is reporting frequent heartburn despite ranitidine 150 once to twice a day and frequent Tums. She currently avoids caffeine with the exception of one coffee in the morning.   Bridget Bell's asthma has been well controlled. She has not required rescue medication, experienced nocturnal awakenings due to lower respiratory symptoms, nor have activities of daily living been limited. She has required no Emergency Department or Urgent Care visits for her asthma. She has required zero courses of systemic steroids for asthma exacerbations since the last visit. ACT score today is 20, indicating excellent asthma symptom control. She continues to receive her Fasenra injections every 8 weeks with no adverse effects. She reports a significant improvement in her asthma symptoms since starting Fasenra injections. She continues Symbicort 160- 2 puffs twice a day, montelukast 10 mg once a day, and rarely uses albuterol. She began using Flovent 110 while she was  coughing in May and has not used this for over 2 weeks now.   Allergic rhinitis is reported as well controlled. She reports symptoms including post nasal drip and throat clearing which are constant for her. She is currently using azelastine nasal spray and Allegra as needed.   Her current medications are listed in the chart.   Drug Allergies:  Allergies  Allergen Reactions  . Amoxicillin-Pot Clavulanate Other (See Comments)    Real bad yeast infection    Physical Exam: BP 126/90 (BP Location: Right Arm, Patient Position: Sitting, Cuff Size: Large)   Pulse 88   Temp 98 F (36.7 C) (Oral)   Resp 12   SpO2 96%    Physical Exam  Constitutional: She is oriented to person, place, and time. She appears well-developed and well-nourished.  HENT:  Head: Normocephalic.  Right Ear: External ear normal.  Left Ear: External ear normal.  Bilateral nares slightly erythematous and edematous with clear nasal drainage. Septal deviation noted. Pharynx slightly erythematous with no exudate noted. Ears normal. Eyes normal.  Eyes: Conjunctivae are normal.  Neck: Normal range of motion. Neck supple.  Cardiovascular: Normal rate, regular rhythm and normal heart sounds.  No murmur noted.  Pulmonary/Chest: Effort normal and breath sounds normal.  Lungs clear to auscultation  Musculoskeletal: Normal range of motion.  Neurological: She is alert and oriented to person, place, and time.  Skin: Skin is warm and dry.  Psychiatric: She has a normal mood and affect. Her behavior is normal. Judgment and thought content normal.    Diagnostics: FVC 3.37, FEV1 2.70. Predicted FVC 3.21, predicted FEV1  2.49. Spirometry is within the normal range.   Assessment and Plan: 1. Hoarseness of voice   2. Gastroesophageal reflux disease without esophagitis   3. Severe persistent asthma without complication   4. Seasonal and perennial allergic rhinitis     No orders of the defined types were placed in this  encounter.   Patient Instructions  Severe persistent asthma without complication  Continue Fasenra injections every 2 months  Continue Symbicort 160-4.5 g, 2 inhalations via spacer device twice daily  Continue montelukast 10 mg daily bedtime  Continue albuterol HFA, 1-2 inhalations every 4-6 hours as needed.  During respiratory tract infections or asthma flares, add Flovent 110g 2 inhalations 2 times per day until symptoms have returned to baseline. Reflux  Begin omeprazole 20 mg once a day  Stop ranitidine for now  Voice hoarseness  Continue nasal saline rinses  Continue azelastine nasal spray  Continue use of spacer device with Symbicort and rinse/gargle/spit afterwards  Refer to ENT for further evaluation  Rhinitis  Continue Allegra once a day as needed for a runny nose  Consider nasal saline rinses   Continue the other medications as listed in the chart  Call me if this treatment plan is not working well for you  Follow up in 3 months or sooner if needed   Return in about 3 months (around 04/18/2018), or if symptoms worsen or fail to improve.    Thank you for the opportunity to care for this patient.  Please do not hesitate to contact me with questions.  Thermon Leyland, FNP [facility]  --------------------------------------------------------  I have provided oversight concerning Bridget Bell's evaluation and treatment of this patient's health issues addressed during today's encounter.  I agree with the assessment and therapeutic plan as outlined in the note.   Signed,   R Jorene Guest, MD

## 2018-01-21 ENCOUNTER — Telehealth: Payer: Self-pay

## 2018-01-21 NOTE — Telephone Encounter (Signed)
-----   Message from Hetty BlendAnne M Ambs, FNP sent at 01/16/2018 12:51 PM EDT ----- Can you please refer this [place] for new voice hoarseness for greater than 5 weeks? Thank you

## 2018-01-21 NOTE — Telephone Encounter (Signed)
Thank you :)

## 2018-01-21 NOTE — Telephone Encounter (Signed)
Referral has been faxed to [facility]

## 2018-02-05 NOTE — Telephone Encounter (Signed)
[facility] has Called 1x they will attemp 3 more times

## 2018-02-12 ENCOUNTER — Ambulatory Visit (INDEPENDENT_AMBULATORY_CARE_PROVIDER_SITE_OTHER): Payer: BLUE CROSS/BLUE SHIELD

## 2018-02-12 DIAGNOSIS — J4550 Severe persistent asthma, uncomplicated: Secondary | ICD-10-CM | POA: Diagnosis not present

## 2018-03-17 ENCOUNTER — Ambulatory Visit (INDEPENDENT_AMBULATORY_CARE_PROVIDER_SITE_OTHER): Payer: BLUE CROSS/BLUE SHIELD | Admitting: *Deleted

## 2018-03-17 DIAGNOSIS — J309 Allergic rhinitis, unspecified: Secondary | ICD-10-CM

## 2018-03-17 NOTE — Progress Notes (Signed)
Immunotherapy   Patient Details  Name: Bridget BathRonda Hedgepeth MRN: [id number] Date of Birth: [birth year]  03/17/2018  Bridget Bathonda Bonini here to pick up  Red vial 1:[address] Following schedule: C  Frequency:1 time per week @ 0.50 every 3 weeks Epi-Pen:Epi-Pen Available  Consent signed and patient instructions given. No problems after 30 minutes in the office.   Mariane DuvalHeather L Harleyquinn Gasser 03/17/2018, 4:04 PM

## 2018-03-18 DIAGNOSIS — J301 Allergic rhinitis due to pollen: Secondary | ICD-10-CM

## 2018-04-01 ENCOUNTER — Other Ambulatory Visit: Payer: Self-pay | Admitting: Allergy & Immunology

## 2018-04-01 NOTE — Telephone Encounter (Signed)
Courtesy refill  

## 2018-04-09 ENCOUNTER — Ambulatory Visit: Payer: Self-pay

## 2018-04-14 ENCOUNTER — Other Ambulatory Visit: Payer: Self-pay | Admitting: Allergy

## 2018-04-14 MED ORDER — SYMBICORT 160-4.5 MCG/ACT IN AERO: 2.0000 | INHALATION_SPRAY | Freq: Two times a day (BID) | RESPIRATORY_TRACT | 0 refills | Status: DC

## 2018-04-18 ENCOUNTER — Telehealth: Payer: Self-pay

## 2018-04-18 DIAGNOSIS — J4540 Moderate persistent asthma, uncomplicated: Secondary | ICD-10-CM

## 2018-04-18 MED ORDER — OPTICHAMBER DIAMOND MISC: 1 refills | Status: AC

## 2018-04-18 NOTE — Telephone Encounter (Signed)
Pt called needing another spacer. Refill was sent to [facility]

## 2018-04-23 ENCOUNTER — Ambulatory Visit: Payer: Self-pay

## 2018-04-24 ENCOUNTER — Ambulatory Visit: Payer: Self-pay

## 2018-06-03 ENCOUNTER — Other Ambulatory Visit: Payer: Self-pay | Admitting: Allergy

## 2018-06-03 MED ORDER — MONTELUKAST SODIUM 10 MG PO TABS: 10.0000 mg | ORAL_TABLET | Freq: Every day | ORAL | 0 refills | Status: DC

## 2018-06-10 ENCOUNTER — Ambulatory Visit (INDEPENDENT_AMBULATORY_CARE_PROVIDER_SITE_OTHER): Payer: BLUE CROSS/BLUE SHIELD

## 2018-06-10 DIAGNOSIS — J4550 Severe persistent asthma, uncomplicated: Secondary | ICD-10-CM

## 2018-06-28 ENCOUNTER — Other Ambulatory Visit: Payer: Self-pay | Admitting: Family Medicine

## 2018-08-02 ENCOUNTER — Other Ambulatory Visit: Payer: Self-pay | Admitting: Allergy & Immunology

## 2018-08-05 ENCOUNTER — Ambulatory Visit (INDEPENDENT_AMBULATORY_CARE_PROVIDER_SITE_OTHER): Payer: BLUE CROSS/BLUE SHIELD | Admitting: *Deleted

## 2018-08-05 DIAGNOSIS — J4550 Severe persistent asthma, uncomplicated: Secondary | ICD-10-CM | POA: Diagnosis not present

## 2018-08-19 ENCOUNTER — Other Ambulatory Visit: Payer: Self-pay | Admitting: Allergy

## 2018-08-19 MED ORDER — MONTELUKAST SODIUM 10 MG PO TABS: 10.0000 mg | ORAL_TABLET | Freq: Every day | ORAL | 0 refills | Status: DC

## 2018-08-28 ENCOUNTER — Other Ambulatory Visit: Payer: Self-pay

## 2018-08-28 MED ORDER — FLUTICASONE PROPIONATE HFA 110 MCG/ACT IN AERO: INHALATION_SPRAY | RESPIRATORY_TRACT | 0 refills | Status: DC

## 2018-08-28 MED ORDER — BUDESONIDE-FORMOTEROL FUMARATE 160-4.5 MCG/ACT IN AERO: 2.0000 | INHALATION_SPRAY | Freq: Two times a day (BID) | RESPIRATORY_TRACT | 0 refills | Status: DC

## 2018-09-03 ENCOUNTER — Ambulatory Visit: Payer: BLUE CROSS/BLUE SHIELD | Admitting: Family Medicine

## 2018-09-03 ENCOUNTER — Encounter: Payer: Self-pay | Admitting: Family Medicine

## 2018-09-03 VITALS — BP 132/80 | HR 100 | Temp 98.0°F | Resp 18

## 2018-09-03 DIAGNOSIS — K219 Gastro-esophageal reflux disease without esophagitis: Secondary | ICD-10-CM | POA: Diagnosis not present

## 2018-09-03 DIAGNOSIS — J4551 Severe persistent asthma with (acute) exacerbation: Secondary | ICD-10-CM

## 2018-09-03 DIAGNOSIS — R490 Dysphonia: Secondary | ICD-10-CM | POA: Diagnosis not present

## 2018-09-03 NOTE — Progress Notes (Addendum)
[address] [place] [address] Dept: [phone]  FOLLOW UP NOTE  Patient ID: Bridget Bell, female    DOB: 1961/06/09  Age: 58 y.o. MRN: [id number] Date of Office Visit: 09/03/2018  Assessment  Chief Complaint: Asthma  HPI Bridget Bell is a 58 year old female who presents to the clinic for a sick visit. She was last seen in this clinic on 01/16/2018 by Thermon Leyland, NP for evaluation of asthma, allergic rhinitis, reflux, and voice hoarseness. At today's visit, she reports that last Thursday she began to experience a headache and scratchy throat. 4 days ago she developed a cough with yellow to green drainage that would clear during the day and she added Flovent 110-2 puffs twice a day to her usual medication regimen. . Last night she began to wheeze and developed chest tightness with breathing. At today's visit, she reports shortness of breath, chest tightness which is briefly relieved with albuterol, wheeze, and cough which is dry. She is currently using Symbicort 160-2 puffs twice a day, Flovent 110-2 puffs twice a day, montelukast 10 mg once a day, and albuterol 2 puffs once a day. She continues benralizumab injections every 8 weeks and reports a significant improvement in her breathing with a decreased need for prednisone since beginning these injections. She denies nasal drainage, however, she reports thick post nasal drainage and increased throat clearing. She is currently using azelastine nasal spray twice a day, Allegra once a day, and Mucinex once a day. She denies heartburn and continues to take omeprazole 40 mg once a day. Her current medications are listed in the chart.    Drug Allergies:  Allergies  Allergen Reactions  . Amoxicillin-Pot Clavulanate Other (See Comments)    Real bad yeast infection    Physical Exam: BP 132/80   Pulse 100   Temp 98 F (36.7 C) (Oral)   Resp 18   SpO2 98%    Physical Exam Vitals signs reviewed.  Constitutional:      Appearance: Normal  appearance.  HENT:     Head: Normocephalic and atraumatic.     Nose:     Comments: Bilateral nares slightly erythematous and edematous with clear nasal drainage noted.  Pharynx slightly erythematous with no exudate.  Ears normal.  Eyes normal. Eyes:     Conjunctiva/sclera: Conjunctivae normal.  Neck:     Musculoskeletal: Normal range of motion and neck supple.  Cardiovascular:     Rate and Rhythm: Normal rate and regular rhythm.     Heart sounds: Normal heart sounds. No murmur.  Pulmonary:     Effort: Pulmonary effort is normal.     Breath sounds: Normal breath sounds.     Comments: Lungs clear to auscultation Musculoskeletal: Normal range of motion.  Skin:    General: Skin is warm and dry.  Neurological:     Mental Status: She is alert and oriented to person, place, and time.  Psychiatric:        Mood and Affect: Mood normal.        Behavior: Behavior normal.        Thought Content: Thought content normal.        Judgment: Judgment normal.     Diagnostics: FVC 3.32, FEV1 2.62.  Predicted FVC 3.18, predicted FEV1 2.46.  Spirometry indicates normal ventilatory function.  Post bronchodilator spirometry FVC 3.17, FEV1 2.54.  Postbronchodilator spirometry indicates normal ventilatory function with no significant bronchodilator response.  Assessment and Plan: 1. Severe persistent asthma with acute exacerbation  2. Gastroesophageal reflux disease without esophagitis   3. Hoarseness of voice      Patient Instructions  Asthma Prednisone 10 mg tablets. Take 2 tablets twice a day for 3 days, then take 2 tablets once a day for 2 day, then take 1 tablet on the 5th day, then stop Continue Symbicort 160-2 puffs twice a day with a spacer to prevent cough and wheeze For now and with asthma flares or respiratory infections, use Flovent 110-2 puffs twice a day for 2 weeks, then stop if you are cough and wheeze free Continue montelukast 10 mg once a day to prevent cough and wheeze Continue  Fasenra injections once every 8 weeks for your asthma  Reflux Continue omeprazole 40 mg once a day as previously written Continue lifestyle modifications  Allergic rhinitis Continue azelastine nasal rinses 2 sprays in each nostril twice a day as needed for a runny nose Continue nasal saline rinses Continue Mucinex [phone] mg twice a day to thin mucus   Call the clinic if you develop a fever, your symptoms worsen or do not improve  Follow up in 1 month or sooner if needed   Lifestyle Changes for Controlling GERD  When you have GERD, stomach acid feels as if it's backing up toward your mouth. Whether or not you take medication to control your GERD, your symptoms can often be improved with lifestyle changes.   Raise Your Head  Reflux is more likely to strike when you're lying down flat, because stomach fluid can  flow backward more easily. Raising the head of your bed 4-6 inches can help. To do this:  Slide blocks or books under the legs at the head of your bed. Or, place a wedge under  the mattress. Many foam stores can make a suitable wedge for you. The wedge  should run from your waist to the top of your head.  Don't just prop your head on several pillows. This increases pressure on your  stomach. It can make GERD worse.  Watch Your Eating Habits Certain foods may increase the acid in your stomach or relax the lower esophageal sphincter, making GERD more likely. It's best to avoid the following:  Coffee, tea, and carbonated drinks (with and without caffeine)  Fatty, fried, or spicy food  Mint, chocolate, onions, and tomatoes  Any other foods that seem to irritate your stomach or cause you pain  Relieve the Pressure  Eat smaller meals, even if you have to eat more often.  Don't lie down right after you eat. Wait a few hours for your stomach to empty.  Avoid tight belts and tight-fitting clothes.  Lose excess weight.  Tobacco and Alcohol  Avoid smoking  tobacco and drinking alcohol. They can make GERD symptoms worse.    Return in about 1 month (around 10/02/2018), or if symptoms worsen or fail to improve.    Thank you for the opportunity to care for this patient.  Please do not hesitate to contact me with questions.  Thermon LeylandAnne Amirra Herling, FNP [facility]  _________________________________________________  I have provided oversight concerning Bridget Bell's evaluation and treatment of this patient's health issues addressed during today's encounter.  I agree with the assessment and therapeutic plan as outlined in the note.   Signed,   R Jorene Guestarter Bobbitt, MD

## 2018-09-03 NOTE — Patient Instructions (Addendum)
Asthma Prednisone 10 mg tablets. Take 2 tablets twice a day for 3 days, then take 2 tablets once a day for 2 day, then take 1 tablet on the 5th day, then stop Continue Symbicort 160-2 puffs twice a day with a spacer to prevent cough and wheeze For now and with asthma flares or respiratory infections, use Flovent 110-2 puffs twice a day for 2 weeks, then stop if you are cough and wheeze free Continue montelukast 10 mg once a day to prevent cough and wheeze Continue Fasenra injections once every 8 weeks for your asthma  Reflux Continue omeprazole 40 mg once a day as previously written Continue lifestyle modifications  Allergic rhinitis Continue azelastine nasal rinses 2 sprays in each nostril twice a day as needed for a runny nose Continue nasal saline rinses Continue Mucinex [phone] mg twice a day to thin mucus   Call the clinic if you develop a fever, your symptoms worsen or do not improve  Follow up in 1 month or sooner if needed   Lifestyle Changes for Controlling GERD  When you have GERD, stomach acid feels as if it's backing up toward your mouth. Whether or not you take medication to control your GERD, your symptoms can often be improved with lifestyle changes.   Raise Your Head  Reflux is more likely to strike when you're lying down flat, because stomach fluid can  flow backward more easily. Raising the head of your bed 4-6 inches can help. To do this:  Slide blocks or books under the legs at the head of your bed. Or, place a wedge under  the mattress. Many foam stores can make a suitable wedge for you. The wedge  should run from your waist to the top of your head.  Don't just prop your head on several pillows. This increases pressure on your  stomach. It can make GERD worse.  Watch Your Eating Habits Certain foods may increase the acid in your stomach or relax the lower esophageal sphincter, making GERD more likely. It's best to avoid the following:  Coffee, tea,  and carbonated drinks (with and without caffeine)  Fatty, fried, or spicy food  Mint, chocolate, onions, and tomatoes  Any other foods that seem to irritate your stomach or cause you pain  Relieve the Pressure  Eat smaller meals, even if you have to eat more often.  Don't lie down right after you eat. Wait a few hours for your stomach to empty.  Avoid tight belts and tight-fitting clothes.  Lose excess weight.  Tobacco and Alcohol  Avoid smoking tobacco and drinking alcohol. They can make GERD symptoms worse.

## 2018-09-10 ENCOUNTER — Ambulatory Visit: Payer: Self-pay | Admitting: Family Medicine

## 2018-09-30 ENCOUNTER — Ambulatory Visit (INDEPENDENT_AMBULATORY_CARE_PROVIDER_SITE_OTHER): Payer: BLUE CROSS/BLUE SHIELD | Admitting: *Deleted

## 2018-09-30 DIAGNOSIS — J4550 Severe persistent asthma, uncomplicated: Secondary | ICD-10-CM | POA: Diagnosis not present

## 2018-10-06 ENCOUNTER — Other Ambulatory Visit: Payer: Self-pay

## 2018-10-06 ENCOUNTER — Ambulatory Visit: Payer: BLUE CROSS/BLUE SHIELD | Admitting: Family Medicine

## 2018-10-06 ENCOUNTER — Encounter: Payer: Self-pay | Admitting: Family Medicine

## 2018-10-06 VITALS — BP 124/76 | HR 99 | Temp 97.7°F | Resp 16

## 2018-10-06 DIAGNOSIS — J302 Other seasonal allergic rhinitis: Secondary | ICD-10-CM

## 2018-10-06 DIAGNOSIS — R490 Dysphonia: Secondary | ICD-10-CM

## 2018-10-06 DIAGNOSIS — R053 Chronic cough: Secondary | ICD-10-CM | POA: Insufficient documentation

## 2018-10-06 DIAGNOSIS — J4550 Severe persistent asthma, uncomplicated: Secondary | ICD-10-CM | POA: Diagnosis not present

## 2018-10-06 DIAGNOSIS — J3089 Other allergic rhinitis: Principal | ICD-10-CM

## 2018-10-06 DIAGNOSIS — R05 Cough: Secondary | ICD-10-CM

## 2018-10-06 DIAGNOSIS — J0100 Acute maxillary sinusitis, unspecified: Secondary | ICD-10-CM

## 2018-10-06 DIAGNOSIS — K219 Gastro-esophageal reflux disease without esophagitis: Secondary | ICD-10-CM

## 2018-10-06 DIAGNOSIS — J309 Allergic rhinitis, unspecified: Secondary | ICD-10-CM

## 2018-10-06 MED ORDER — FAMOTIDINE 20 MG PO TABS: 20.0000 mg | ORAL_TABLET | Freq: Two times a day (BID) | ORAL | 5 refills | Status: DC

## 2018-10-06 MED ORDER — IPRATROPIUM BROMIDE 0.03 % NA SOLN: 2.0000 | Freq: Two times a day (BID) | NASAL | 5 refills | Status: DC

## 2018-10-06 MED ORDER — AZELASTINE HCL 0.1 % NA SOLN: NASAL | 3 refills | Status: DC

## 2018-10-06 MED ORDER — TIOTROPIUM BROMIDE MONOHYDRATE 1.25 MCG/ACT IN AERS: 2.0000 | INHALATION_SPRAY | Freq: Every day | RESPIRATORY_TRACT | 3 refills | Status: DC

## 2018-10-06 NOTE — Patient Instructions (Addendum)
Asthma Begin Spiriva Respimat 1.25 -2 puffs once a day to prevent cough and wheeze Continue Symbicort 160-2 puffs twice a day with a spacer to prevent cough and wheeze For now and with asthma flares or respiratory infections, use Flovent 110-2 puffs twice a day for 2 weeks, then stop if you are cough and wheeze free Continue montelukast 10 mg once a day to prevent cough and wheeze Continue Fasenra injections once every 8 weeks for your asthma  Reflux Begin famotidine 20 mg twice a day to control reflux Continue omeprazole 40 mg once a day  Continue lifestyle modifications  Allergic rhinitis Begin Atrovent nasal spray 0.03% -2 sprays in each nostril twice a day  Continue azelastine nasal rinses 2 sprays in each nostril twice a day as needed for a runny nose Continue nasal saline rinses Continue Mucinex [phone] mg twice a day to thin mucus   Cough Treatment as above Use flutter valve 4 times a day Call the clinic if you develop a fever, your symptoms worsen or do not improve  Follow up in 2 months or sooner if needed

## 2018-10-06 NOTE — Progress Notes (Addendum)
[address] [place] [address] Dept: [phone]  FOLLOW UP NOTE  Patient ID: Bridget Bell, female    DOB: 02-26-1961  Age: 58 y.o. MRN: [id number] Date of Office Visit: 10/06/2018  Assessment  Chief Complaint: Cough (TIMES 3 WEEKS)  HPI Bridget Bell is a 58 year old female who presents to the clinic for a follow up visit. She was last seen in this clinic on 09/03/2018 by Thermon Leyland, NP for evaluation of asthma, allergic rhinitis, reflux, and voice hoarseness. At today's visit, she reports that she has been coughing for over 1 year. The cough is reported as a tickle in her throat which occasionally is clear mucus producing. She denies post nasal drip and throat clearing. She is currently taking guaifenesin as needed, Allegra once a day, azelastine as needed, and using saline nasal rinses daily. She reports asthma as moderately well controlled with wheezing and cough which is worse at the end of the day and at night. She is currently taking montelukast once a day, Symbicort 160-2 puffs twice a day with a spacer, Flovent 110-2 puffs twice a day for the last few weeks, and using albuterol several times a day. Reflux is reported as well controlled with no heartburn while taking omeprazole 40 mg once a day. She reports that food and carbonation settle her cough.  Her current medications are listed in the chart.    Drug Allergies:  Allergies  Allergen Reactions  . Amoxicillin-Pot Clavulanate Other (See Comments)    Real bad yeast infection    Physical Exam: BP 124/76   Pulse 99   Temp 97.7 F (36.5 C) (Oral)   Resp 16   SpO2 96%    Physical Exam Vitals signs reviewed.  Constitutional:      Appearance: Normal appearance.  HENT:     Head: Normocephalic and atraumatic.     Right Ear: Tympanic membrane normal.     Left Ear: Tympanic membrane normal.     Nose:     Comments: Bilateral nares slightly erythematous with clear nasal drainage noted. Pharynx slightly erythematous with  no exudate noted. Ears normal. Eyes normal. Neck:     Musculoskeletal: Normal range of motion and neck supple.  Cardiovascular:     Rate and Rhythm: Normal rate and regular rhythm.     Heart sounds: Normal heart sounds. No murmur.  Pulmonary:     Effort: Pulmonary effort is normal.     Breath sounds: Normal breath sounds.     Comments: Lungs clear to auscultation Musculoskeletal: Normal range of motion.  Skin:    General: Skin is warm and dry.  Neurological:     Mental Status: She is alert and oriented to person, place, and time.  Psychiatric:        Mood and Affect: Mood normal.        Behavior: Behavior normal.        Thought Content: Thought content normal.        Judgment: Judgment normal.     Diagnostics: FVC 3.36, FEV1 2.58. Predicted FVC 3.33, predicted FEV1 2.57. Spirometry is within the normal range.  Assessment and Plan: 1. Severe persistent asthma without complication   2. Allergic rhinitis, unspecified seasonality, unspecified trigger   3. Gastroesophageal reflux disease without esophagitis   4. Hoarseness of voice   5. Chronic cough     Meds ordered this encounter  Medications  . Tiotropium Bromide Monohydrate (SPIRIVA RESPIMAT) 1.25 MCG/ACT AERS    Sig: Inhale 2 puffs  into the lungs daily.    Dispense:  12 g    Refill:  3  . famotidine (PEPCID) 20 MG tablet    Sig: Take 1 tablet (20 mg total) by mouth 2 (two) times daily.    Dispense:  60 tablet    Refill:  5  . ipratropium (ATROVENT) 0.03 % nasal spray    Sig: Place 2 sprays into both nostrils 2 (two) times daily.    Dispense:  30 mL    Refill:  5    Patient Instructions  Asthma Begin Spiriva Respimat 1.25 -2 puffs once a day to prevent cough and wheeze Continue Symbicort 160-2 puffs twice a day with a spacer to prevent cough and wheeze For now and with asthma flares or respiratory infections, use Flovent 110-2 puffs twice a day for 2 weeks, then stop if you are cough and wheeze free Continue  montelukast 10 mg once a day to prevent cough and wheeze Continue Fasenra injections once every 8 weeks for your asthma  Reflux Begin famotidine 20 mg twice a day to control reflux Continue omeprazole 40 mg once a day  Continue lifestyle modifications  Allergic rhinitis Begin Atrovent nasal spray 0.03% -2 sprays in each nostril twice a day  Continue azelastine nasal rinses 2 sprays in each nostril twice a day as needed for a runny nose Continue nasal saline rinses Continue Mucinex [phone] mg twice a day to thin mucus   Cough Treatment as above Use flutter valve 4 times a day Call the clinic if you develop a fever, your symptoms worsen or do not improve  Follow up in 2 months or sooner if needed   Return in about 2 months (around 12/06/2018), or if symptoms worsen or fail to improve.    Thank you for the opportunity to care for this patient.  Please do not hesitate to contact me with questions.  Thermon Leyland, FNP [facility]  _________________________________________________  I have provided oversight concerning Bridget Bell's evaluation and treatment of this patient's health issues addressed during today's encounter.  I agree with the assessment and therapeutic plan as outlined in the note.   Signed,   R Jorene Guest, MD

## 2018-10-10 ENCOUNTER — Telehealth: Payer: Self-pay

## 2018-10-10 MED ORDER — PREDNISONE 10 MG PO TABS: ORAL_TABLET | ORAL | 0 refills | Status: DC

## 2018-10-10 MED ORDER — AZITHROMYCIN 250 MG PO TABS: ORAL_TABLET | ORAL | 0 refills | Status: DC

## 2018-10-10 NOTE — Telephone Encounter (Signed)
Pt. Calling stating she is no better not only from her Monday visit but when she was in our office in February when she was coughing up green and was only given prednisone and no antibiotic given at that visit or on Monday. Pt. States her chest is still tight, coughing a lot, not sure if there is any color when she coughs up a little mucus and she is still wheezing. Pt. Stated that Dr. Dellis Anes always gives her an antibiotic and sometimes she states she has to have 2 rounds of antibiotics when she has infection in her lungs per pt. Pt. Also stating she hasn't slept well In 5 weeks and wanting another round of prednisone and an antibiotic.

## 2018-10-10 NOTE — Telephone Encounter (Signed)
Can you please call azithromycin 250 mg tablets. Take 2 tablets on the first day, then take 1 tablet for the next 4 days, then stop. Please give 2 pred and a dose pack. Follow up in 2 weeks or sooner. Will do c-xray if no better at that time. Thank you

## 2018-10-10 NOTE — Telephone Encounter (Signed)
Sent in prescribed medication written in note and scheduled pt. For April 2cd at 11:00 am. Pt. Aware.

## 2018-10-14 ENCOUNTER — Telehealth: Payer: Self-pay | Admitting: Family Medicine

## 2018-10-14 NOTE — Telephone Encounter (Signed)
Note written and printed off.  Malachi Bonds, MD [facility]

## 2018-10-14 NOTE — Telephone Encounter (Signed)
Faxed note to pts school listed in previous telephone contact

## 2018-10-14 NOTE — Telephone Encounter (Signed)
PT called in because she needs a work from home recommendation/dr note to be sent to her employer. This note is to excuse her from work due to covid and her current conditions. She is asking that this be faxed over today, if possible.  [employer]  fax: [phone] attn: Berniece Andreas

## 2018-10-18 ENCOUNTER — Other Ambulatory Visit: Payer: Self-pay | Admitting: Allergy & Immunology

## 2018-10-20 NOTE — Telephone Encounter (Signed)
Please advise 

## 2018-10-23 ENCOUNTER — Ambulatory Visit: Payer: Self-pay | Admitting: Family Medicine

## 2018-11-14 ENCOUNTER — Other Ambulatory Visit: Payer: Self-pay | Admitting: *Deleted

## 2018-11-14 MED ORDER — MONTELUKAST SODIUM 10 MG PO TABS: 10.0000 mg | ORAL_TABLET | Freq: Every day | ORAL | 3 refills | Status: DC

## 2018-11-22 ENCOUNTER — Other Ambulatory Visit: Payer: Self-pay | Admitting: Allergy & Immunology

## 2018-11-25 ENCOUNTER — Ambulatory Visit: Payer: Self-pay

## 2018-12-02 ENCOUNTER — Other Ambulatory Visit: Payer: Self-pay

## 2018-12-02 ENCOUNTER — Ambulatory Visit (INDEPENDENT_AMBULATORY_CARE_PROVIDER_SITE_OTHER): Payer: BLUE CROSS/BLUE SHIELD

## 2018-12-02 DIAGNOSIS — J4550 Severe persistent asthma, uncomplicated: Secondary | ICD-10-CM

## 2018-12-22 DIAGNOSIS — J301 Allergic rhinitis due to pollen: Secondary | ICD-10-CM

## 2018-12-22 NOTE — Progress Notes (Signed)
Vials exp 12-22-2019

## 2019-01-02 ENCOUNTER — Ambulatory Visit: Payer: BC Managed Care – PPO | Admitting: Family Medicine

## 2019-01-02 ENCOUNTER — Other Ambulatory Visit: Payer: Self-pay

## 2019-01-02 ENCOUNTER — Encounter: Payer: Self-pay | Admitting: Family Medicine

## 2019-01-02 ENCOUNTER — Ambulatory Visit: Payer: Self-pay

## 2019-01-02 VITALS — BP 122/80 | HR 101 | Temp 97.9°F | Resp 18

## 2019-01-02 DIAGNOSIS — J4550 Severe persistent asthma, uncomplicated: Secondary | ICD-10-CM

## 2019-01-02 DIAGNOSIS — J309 Allergic rhinitis, unspecified: Secondary | ICD-10-CM

## 2019-01-02 DIAGNOSIS — R05 Cough: Secondary | ICD-10-CM

## 2019-01-02 DIAGNOSIS — K219 Gastro-esophageal reflux disease without esophagitis: Secondary | ICD-10-CM

## 2019-01-02 DIAGNOSIS — R053 Chronic cough: Secondary | ICD-10-CM

## 2019-01-02 MED ORDER — DULERA 200-5 MCG/ACT IN AERO: 2.0000 | INHALATION_SPRAY | Freq: Two times a day (BID) | RESPIRATORY_TRACT | 5 refills | Status: DC

## 2019-01-02 NOTE — Progress Notes (Signed)
[address] [place] [address] Dept: [phone]  FOLLOW UP NOTE  Patient ID: Bridget Bell, female    DOB: March 08, 1961  Age: 58 y.o. MRN: [id number] Date of Office Visit: 01/02/2019  Assessment  Chief Complaint: Asthma  HPI Bridget Bell is a 58 year old female who presents to the clinic for a follow up visit. She was last seen in the clinic on 10/06/2018 by Gareth Morgan, FNP for evaluation of asthma, allergic rhinitis, chronic cough with voice hoarseness, and reflux. At today's visit, she reports that her asthma has not been well controlled with shortness of breath and wheeze when she has been outside and cough which occasionally produces clear mucus. She reports occasional post tussive vomiting. She is currently taking Symbicort 166- 2 puffs twice a day with a spacer, montelukast 10 mg once a day, Flovent 110-2 puffs twice a day with a spacer, and albuterol every morning on a schedule. She feels as though the Berna Bue is not working as well as it did initially. Allergic rhinitis is reported as poorly controlled with nasal congestion. She is using ipratroprium most days and reports that azelastine and nasal saline have caused her nose to bleed on one occasion. She reports occasional heartburn that begins after she eats foods and then starts coughing. She continues omeprazole 40 mg once a day and reports a reduction in heartburn since beginning this regimen. Her current medications are listed in the chart.   Drug Allergies:  Allergies  Allergen Reactions  . Amoxicillin-Pot Clavulanate Other (See Comments)    Real bad yeast infection    Physical Exam: BP 122/80   Pulse (!) 101   Temp 97.9 F (36.6 C) (Tympanic)   Resp 18   SpO2 100%    Physical Exam Vitals signs reviewed.  Constitutional:      Appearance: Normal appearance.  HENT:     Head: Normocephalic and atraumatic.     Right Ear: Tympanic membrane normal.     Left Ear: Tympanic membrane normal.     Nose:     Comments:  Bilateral nares slightly erythematous with clear nasal drainage noted. Pharynx normal. Ears normal. Eyes normal.    Mouth/Throat:     Pharynx: Oropharynx is clear.  Eyes:     Conjunctiva/sclera: Conjunctivae normal.  Neck:     Musculoskeletal: Normal range of motion and neck supple.  Cardiovascular:     Rate and Rhythm: Normal rate and regular rhythm.     Heart sounds: Normal heart sounds. No murmur.  Pulmonary:     Effort: Pulmonary effort is normal.     Breath sounds: Normal breath sounds.     Comments: Lungs clear to auscultation Musculoskeletal: Normal range of motion.     Right lower leg: No edema.     Left lower leg: No edema.  Skin:    General: Skin is warm and dry.  Neurological:     Mental Status: She is alert and oriented to person, place, and time.  Psychiatric:        Mood and Affect: Mood normal.        Behavior: Behavior normal.        Thought Content: Thought content normal.        Judgment: Judgment normal.     Diagnostics: FVC 3.30, FEV1 2.63. Predicted FVC 3.33, predicted FEV1 2.57. Spirometry indicates normal ventilatory function.   Assessment and Plan: 1. Severe persistent asthma without complication   2. Allergic rhinitis, unspecified seasonality, unspecified trigger  3. Chronic cough   4. Gastroesophageal reflux disease without esophagitis     Meds ordered this encounter  Medications  . mometasone-formoterol (DULERA) 200-5 MCG/ACT AERO    Sig: Inhale 2 puffs into the lungs 2 (two) times daily.    Dispense:  1 Inhaler    Refill:  5    Hold. Patient will call when needed    Patient Instructions  Asthma Begin Spiriva Respimat 1.25 -2 puffs once a day to prevent cough and wheeze Stop Symbicort and begin Dulera 200-2 puffs twice a day with a spacer to prevent cough and wheeze For now and with asthma flares or respiratory infections, use Flovent 110-2 puffs twice a day for 2 weeks, then stop if you are cough and wheeze free Continue montelukast 10  mg once a day to prevent cough and wheeze For now, continue Fasenra injections once every 8 weeks for your asthma. We will apply for a different biologic medication to control your asthma  Reflux Begin famotidine 20 mg twice a day to control reflux Continue omeprazole 40 mg once a day  Continue lifestyle modifications  Allergic rhinitis Return to the clinic to update your skin testing. Remember not to take any Allegra for 3 days before the skin testing appointment Continue Atrovent nasal spray 0.03% -2 sprays in each nostril twice a day  Continue azelastine nasal rinses 2 sprays in each nostril twice a day as needed for a runny nose. Do not use this if you experience a bloody nose Continue allergen immunotherapy once every 3 weeks after the build up and have an epinephrine device available Continue nasal saline rinses as needed for nasal symptoms Begin saline nasal gel Continue Mucinex [phone] mg twice a day to thin mucus   Cough Treatment as above Use flutter valve 4 times a day Call the clinic if you develop a fever, your symptoms worsen or do not improve  Follow up in 2 months or sooner if needed    Return in about 2 months (around 03/04/2019), or if symptoms worsen or fail to improve.    Thank you for the opportunity to care for this patient.  Please do not hesitate to contact me with questions.  Thermon LeylandAnne , FNP [facility]

## 2019-01-02 NOTE — Patient Instructions (Addendum)
Asthma Begin Spiriva Respimat 1.25 -2 puffs once a day to prevent cough and wheeze Stop Symbicort and begin Dulera 200-2 puffs twice a day with a spacer to prevent cough and wheeze For now and with asthma flares or respiratory infections, use Flovent 110-2 puffs twice a day for 2 weeks, then stop if you are cough and wheeze free Continue montelukast 10 mg once a day to prevent cough and wheeze For now, continue Fasenra injections once every 8 weeks for your asthma. We will apply for a different biologic medication to control your asthma  Reflux Begin famotidine 20 mg twice a day to control reflux Continue omeprazole 40 mg once a day  Continue lifestyle modifications  Allergic rhinitis Return to the clinic to update your skin testing. Remember not to take any Allegra for 3 days before the skin testing appointment Continue Atrovent nasal spray 0.03% -2 sprays in each nostril twice a day  Continue azelastine nasal rinses 2 sprays in each nostril twice a day as needed for a runny nose. Do not use this if you experience a bloody nose Continue allergen immunotherapy once every 3 weeks after the build up and have an epinephrine device available Continue nasal saline rinses as needed for nasal symptoms Begin saline nasal gel Continue Mucinex [phone] mg twice a day to thin mucus   Cough Treatment as above Use flutter valve 4 times a day Call the clinic if you develop a fever, your symptoms worsen or do not improve  Follow up in 2 months or sooner if needed

## 2019-01-02 NOTE — Progress Notes (Signed)
Immunotherapy   Patient Details  Name: Bridget Bell MRN: [id number] Date of Birth: Jun 25, 1961  01/02/2019  [name]  Pt here to pick up red vial mt 1:100 grass and tree  Following schedule: c  Frequency:1 time a week until 0.5 then q 3 weeks Epi-Pen:yes Consent signed and patient instructions given.   [name] 01/02/2019, 11:17 AM

## 2019-01-12 ENCOUNTER — Other Ambulatory Visit: Payer: Self-pay

## 2019-01-12 ENCOUNTER — Ambulatory Visit: Payer: BC Managed Care – PPO | Admitting: Pediatrics

## 2019-01-12 ENCOUNTER — Encounter: Payer: Self-pay | Admitting: Pediatrics

## 2019-01-12 VITALS — BP 108/70 | HR 100 | Temp 97.7°F | Resp 16

## 2019-01-12 DIAGNOSIS — J3089 Other allergic rhinitis: Secondary | ICD-10-CM

## 2019-01-12 DIAGNOSIS — K219 Gastro-esophageal reflux disease without esophagitis: Secondary | ICD-10-CM

## 2019-01-12 DIAGNOSIS — I10 Essential (primary) hypertension: Secondary | ICD-10-CM

## 2019-01-12 DIAGNOSIS — J4540 Moderate persistent asthma, uncomplicated: Secondary | ICD-10-CM

## 2019-01-12 MED ORDER — OMEPRAZOLE 40 MG PO CPDR: DELAYED_RELEASE_CAPSULE | ORAL | 3 refills | Status: DC

## 2019-01-12 MED ORDER — FLUTICASONE-SALMETEROL 500-50 MCG/DOSE IN AEPB: INHALATION_SPRAY | RESPIRATORY_TRACT | 3 refills | Status: DC

## 2019-01-12 MED ORDER — ALBUTEROL SULFATE HFA 108 (90 BASE) MCG/ACT IN AERS: 2.0000 | INHALATION_SPRAY | RESPIRATORY_TRACT | 1 refills | Status: DC | PRN

## 2019-01-12 MED ORDER — TRIAMCINOLONE ACETONIDE 55 MCG/ACT NA AERO: INHALATION_SPRAY | NASAL | 3 refills | Status: DC

## 2019-01-12 NOTE — Patient Instructions (Addendum)
Environmental control of dust mite Allegra 180 mg-take 1 tablet once a day if needed for runny nose or itchy eyes Nasacort 1 spray per nostril twice a day if needed for stuffy nose Ipratropium 0.03% nasal spray-use 2 sprays 3 times a day if needed for runny nose Montelukast 10 mg-take 1 tablet once a day to prevent coughing or wheezing Advair Diskus 500/50-1 puff every 12 hours to prevent coughing or wheezing.  Rinse gargle and spit out after use, instead of Dulera 200 Spiriva Respimat 1.25-2 puffs every 24 hours to prevent coughing or wheezing Ventolin 2 puffs every 4 hours if needed for wheezing or coughing spells.  You may use Ventolin 2 puffs 5 to 15 minutes before exercise Omeprazole 40 mg capsule-take 1 capsule twice a day to prevent reflux Continuing your other medications Call us if you are not doing well on this treatment plan  We will apply for the use of dupilumab to see how well your insurance would cover it. Continue Fasenra  until we can do the switch to dupilumab You may continue on your grass and tree pollen injections every 4  weeks

## 2019-01-12 NOTE — Progress Notes (Signed)
[address] [place] [address] Dept: [phone]  FOLLOW UP NOTE  Patient ID: Bridget Bell, female    DOB: 01-11-1961  Age: 58 y.o. MRN: [id number] Date of Office Visit: 01/12/2019  Assessment  Chief Complaint: Allergy Testing  HPI Bridget Bell presents for allergy testing to evaluate her chronic cough.  She is on allergy injections to grass pollens and tree pollens She does not feel that Berna Bue is helping her as much as before and we are looking at a different biological.  She is on Dulera 200-2 puffs twice a day , Spiriva Respimat 1.25-2 puffs once a day , montelukast 10 mg once a day , and she has Ventolin to use 2 puffs every 4 hours if needed.  Her gastroesophageal reflux is controlled with omeprazole 40 mg once a day.  She has not added famotidine 20 mg twice a day to see if it helps with her cough more.  She feels that Atrovent nasal spray 0.03% 2 sprays per nostril twice a day is helping a runny nose She is on nasal Astelin 0.03% - 2 sprays in each nostril twice a day.   In January 2018 she was started on Xolair.  In May 2018 she was started on Fasenra   Drug Allergies:  Allergies  Allergen Reactions  . Amoxicillin-Pot Clavulanate Other (See Comments)    Real bad yeast infection    Physical Exam: BP 108/70 (BP Location: Left Arm, Patient Position: Sitting, Cuff Size: Large)   Pulse 100   Temp 97.7 F (36.5 C) (Oral)   Resp 16   SpO2 99%    Physical Exam Vitals signs reviewed.  Constitutional:      Appearance: Normal appearance. She is normal weight.  HENT:     Head:     Comments: Eyes normal.  Ears normal.  Nose normal.  Pharynx normal. Neck:     Musculoskeletal: Neck supple.  Cardiovascular:     Comments: S1-S2 normal no murmurs Pulmonary:     Comments: Clear to percussion and auscultation Lymphadenopathy:     Cervical: No cervical adenopathy.  Neurological:     General: No focal deficit present.     Mental Status: She is alert and oriented to  person, place, and time.  Psychiatric:        Mood and Affect: Mood normal.        Behavior: Behavior normal.        Thought Content: Thought content normal.        Judgment: Judgment normal.     Diagnostics: Allergy skin test were positive to dust mites cat, dog, cockroach.  Slight reactivity to some molds  Assessment and Plan: 1. Moderate persistent asthma without complication   2. Other allergic rhinitis   3. Gastroesophageal reflux disease without esophagitis   4. Essential hypertension     Meds ordered this encounter  Medications  . Fluticasone-Salmeterol (ADVAIR DISKUS) 500-50 MCG/DOSE AEPB    Sig: 1 puff every 12 hours to prevent coughing or wheezing    Dispense:  3 each    Refill:  3    Dispense 90 day supply.  Marland Kitchen omeprazole (PRILOSEC) 40 MG capsule    Sig: Take 1 capsule twice a day to prevent reflux    Dispense:  180 capsule    Refill:  3    Please dispense 90 day supply.  . triamcinolone (NASACORT) 55 MCG/ACT AERO nasal inhaler    Sig: 1 spray per nostril twice a day if needed for stuffy  nose    Dispense:  3 Inhaler    Refill:  3  . albuterol (VENTOLIN HFA) 108 (90 Base) MCG/ACT inhaler    Sig: Inhale 2 puffs into the lungs every 4 (four) hours as needed for wheezing or shortness of breath.    Dispense:  18 g    Refill:  1    Patient Instructions  Environmental control of dust mite Allegra 180 mg-take 1 tablet once a day if needed for runny nose or itchy eyes Nasacort 1 spray per nostril twice a day if needed for stuffy nose Ipratropium 0.03% nasal spray-use 2 sprays 3 times a day if needed for runny nose Montelukast 10 mg-take 1 tablet once a day to prevent coughing or wheezing Advair Diskus 500/50-1 puff every 12 hours to prevent coughing or wheezing.  Rinse gargle and spit out after use, instead of Dulera 200 Spiriva Respimat 1.25-2 puffs every 24 hours to prevent coughing or wheezing Ventolin 2 puffs every 4 hours if needed for wheezing or coughing  spells.  You may use Ventolin 2 puffs 5 to 15 minutes before exercise Omeprazole 40 mg capsule-take 1 capsule twice a day to prevent reflux Continuing your other medications Call us if you are not doing well on this treatment plan  We will apply for the use of dupilumab to see how well your insurance would cover it. Continue Fasenra  until we can do the switch to dupilumab You may continue on your grass and tree pollen injections every 4  weeks     Return in about 4 weeks (around 02/09/2019).    Thank you for the opportunity to care for this patient.  Please do not hesitate to contact me with questions.  Tonette BihariJ. A. Bardelas, M.D.  [facility] [address] [place], [place] [address] [phone]

## 2019-01-27 ENCOUNTER — Other Ambulatory Visit: Payer: Self-pay

## 2019-01-27 ENCOUNTER — Ambulatory Visit (INDEPENDENT_AMBULATORY_CARE_PROVIDER_SITE_OTHER): Payer: BC Managed Care – PPO

## 2019-01-27 DIAGNOSIS — J4551 Severe persistent asthma with (acute) exacerbation: Secondary | ICD-10-CM | POA: Diagnosis not present

## 2019-02-09 ENCOUNTER — Other Ambulatory Visit: Payer: Self-pay

## 2019-02-09 ENCOUNTER — Ambulatory Visit: Payer: BC Managed Care – PPO | Admitting: Pediatrics

## 2019-02-09 VITALS — BP 118/70 | HR 104 | Temp 97.3°F | Resp 18

## 2019-02-09 DIAGNOSIS — J3089 Other allergic rhinitis: Secondary | ICD-10-CM

## 2019-02-09 DIAGNOSIS — K219 Gastro-esophageal reflux disease without esophagitis: Secondary | ICD-10-CM

## 2019-02-09 DIAGNOSIS — J4540 Moderate persistent asthma, uncomplicated: Secondary | ICD-10-CM

## 2019-02-09 DIAGNOSIS — I10 Essential (primary) hypertension: Secondary | ICD-10-CM

## 2019-02-09 NOTE — Progress Notes (Signed)
[address] [place] [address] Dept: [phone]  FOLLOW UP NOTE  Patient ID: Bridget Bell, female    DOB: 05/09/1961  Age: 57 y.o. MRN: [id number] Date of Office Visit: 02/09/2019  Assessment  Chief Complaint: Asthma  HPI Bridget Bell presents for follow-up of asthma and allergic rhinitis.  Her coughing and asthma have been improved with the use of montelukast 10 mg once a day, Advair Diskus 500/50- 1 puff every 12 hours, Spiriva Respimat 1.2 5-2 puffs every 24 hours.  She very rarely has to use Ventolin.  Her nasal symptoms are controlled by Allegra 180 mg once a day and Nasacort 1 spray per nostril twice a day and ipratropium 0.03% 2 sprays per nostril 3 times a day.  She continues on omeprazole 40 mg twice a day to prevent reflux.  She continues on injections to grass and tree pollens every 4 weeks.  She is currently on Fasenra but we have explored  the use of dupilumab if her asthma is not better controlled.   Drug Allergies:  Allergies  Allergen Reactions  . Amoxicillin-Pot Clavulanate Other (See Comments)    Real bad yeast infection    Physical Exam: BP 118/70 (BP Location: Left Arm, Patient Position: Sitting, Cuff Size: Large)   Pulse (!) 104   Temp (!) 97.3 F (36.3 C) (Temporal)   Resp 18   SpO2 95%    Physical Exam Constitutional:      Appearance: Normal appearance. She is obese.  HENT:     Head:     Comments: Eyes normal.  Ears normal. Nose  normal.  Pharynx normal. Neck:     Musculoskeletal: Neck supple.  Cardiovascular:     Comments: S1-S2 normal no murmurs Pulmonary:     Comments: Clear to percussion and auscultation Lymphadenopathy:     Cervical: No cervical adenopathy.  Neurological:     General: No focal deficit present.     Mental Status: She is alert and oriented to person, place, and time. Mental status is at baseline.  Psychiatric:        Mood and Affect: Mood normal.        Behavior: Behavior normal.        Thought Content: Thought  content normal.        Judgment: Judgment normal.     Diagnostics: FVC 3.30 L FEV1 2.71 L.  Predicted FVC 3.33 L predicted FEV1 2.57 L-the spirometry is in the normal range  Assessment and Plan: 1. Moderate persistent asthma without complication   2. Gastroesophageal reflux disease without esophagitis   3. Other allergic rhinitis   4. Essential hypertension     No orders of the defined types were placed in this encounter.   Patient Instructions  Montelukast 10 mg-take 1 tablet once a day to prevent coughing or wheezing Advair Diskus 500/51 puff every 12 hours to prevent coughing and wheezing.  Rinse gargle and spit out after use Spiriva Respimat 1.25-2 puffs every 24 hours to prevent coughing or wheezing Ventolin 2 puffs every 4 hours if needed for wheezing or coughing spells.  You may use Ventolin 2 puffs 5 to 15 minutes before exercise.  Allegra 180 mg-take 1 tablet once a day if needed for runny nose or itchy eyes Nasacort 1 spray per nostril twice a day for stuffy nose Ipratropium 0.03% nasal spray-2 sprays 3 times a day if needed for runny nose Continue allergy injections to grass and tree pollens every 4 weeks  Omeprazole 40 mg capsule-take  1 capsule twice a day to prevent reflux Continue on  your other medications  Call us if you are not doing well on this treatment plan  Continue Fasenra.  We will explore the use of dupilumab if you do not do well on Fasenra   Return in about 6 months (around 08/12/2019).    Thank you for the opportunity to care for this patient.  Please do not hesitate to contact me with questions.  Tonette BihariJ. A. Bryar Rennie, M.D.  [facility] [address] [place], [place] [address] [phone]

## 2019-02-10 NOTE — Patient Instructions (Signed)
Montelukast 10 mg-take 1 tablet once a day to prevent coughing or wheezing Advair Diskus 500/51 puff every 12 hours to prevent coughing and wheezing.  Rinse gargle and spit out after use Spiriva Respimat 1.25-2 puffs every 24 hours to prevent coughing or wheezing Ventolin 2 puffs every 4 hours if needed for wheezing or coughing spells.  You may use Ventolin 2 puffs 5 to 15 minutes before exercise.  Allegra 180 mg-take 1 tablet once a day if needed for runny nose or itchy eyes Nasacort 1 spray per nostril twice a day for stuffy nose Ipratropium 0.03% nasal spray-2 sprays 3 times a day if needed for runny nose Continue allergy injections to grass and tree pollens every 4 weeks  Omeprazole 40 mg capsule-take 1 capsule twice a day to prevent reflux Continue on  your other medications  Call us if you are not doing well on this treatment plan  Continue Fasenra.  We will explore the use of dupilumab if you do not do well on Fasenra

## 2019-02-24 ENCOUNTER — Other Ambulatory Visit: Payer: Self-pay

## 2019-02-24 MED ORDER — ALBUTEROL SULFATE (2.5 MG/3ML) 0.083% IN NEBU: INHALATION_SOLUTION | RESPIRATORY_TRACT | 2 refills | Status: DC

## 2019-02-24 NOTE — Telephone Encounter (Signed)
Patient wants albuterol called in to [facility] [place].  Patient seen last week by Dr. Shaune Leeks.  Rx sent in.

## 2019-03-02 ENCOUNTER — Telehealth: Payer: Self-pay | Admitting: *Deleted

## 2019-03-02 NOTE — Telephone Encounter (Signed)
Pt called stating she has been coughing and wheezing x 2 weeks since her last apt 02/09/19. She is using all prescribed meds and using albuterol neb treatments every 4 hours. I scheduled her for another office visit tomorrow 8/11 @ 11am.

## 2019-03-03 ENCOUNTER — Other Ambulatory Visit: Payer: Self-pay

## 2019-03-03 ENCOUNTER — Ambulatory Visit: Payer: Self-pay | Admitting: Pediatrics

## 2019-03-03 ENCOUNTER — Encounter: Payer: Self-pay | Admitting: Pediatrics

## 2019-03-03 ENCOUNTER — Ambulatory Visit (INDEPENDENT_AMBULATORY_CARE_PROVIDER_SITE_OTHER): Payer: BC Managed Care – PPO | Admitting: Pediatrics

## 2019-03-03 VITALS — BP 140/78 | HR 105 | Temp 97.5°F | Resp 18

## 2019-03-03 DIAGNOSIS — J4551 Severe persistent asthma with (acute) exacerbation: Secondary | ICD-10-CM | POA: Diagnosis not present

## 2019-03-03 DIAGNOSIS — J4540 Moderate persistent asthma, uncomplicated: Secondary | ICD-10-CM | POA: Diagnosis not present

## 2019-03-03 DIAGNOSIS — J208 Acute bronchitis due to other specified organisms: Secondary | ICD-10-CM

## 2019-03-03 DIAGNOSIS — K219 Gastro-esophageal reflux disease without esophagitis: Secondary | ICD-10-CM | POA: Diagnosis not present

## 2019-03-03 DIAGNOSIS — I10 Essential (primary) hypertension: Secondary | ICD-10-CM | POA: Diagnosis not present

## 2019-03-03 MED ORDER — AZITHROMYCIN 250 MG PO TABS: ORAL_TABLET | ORAL | 0 refills | Status: DC

## 2019-03-03 NOTE — Progress Notes (Signed)
[address] [place] [id number] Dept: [phone]  FOLLOW UP NOTE  Patient ID: Bridget Bell Mincy, female    DOB: Sep 23, 1960  Age: 58 y.o. MRN: [id number] Date of Office Visit: 03/03/2019  Assessment  Chief Complaint: Asthma  HPI Bridget Bell Yanda presents for evaluation of a cough for about 2 weeks.  She is on omeprazole 40 mg twice a day.  She is having a discolored mucus.  Sometimes with the coughing spells she has some posttussive vomiting.  She is on Advair Diskus 500/50-1 puff every 12 hours, montelukast 10 mg once a day and Spiriva Respimat 1.25-2 puffs every 24 hours.  She is scheduled to have the next Fasenra injection on September 1.  She is on Allegra 180 mg once a day and Nasacort 1 spray per nostril twice a day and ipratropium 0.03% 2 sprays per nostril 3 times a day Other current medications are outlined in the chart   Drug Allergies:  Allergies  Allergen Reactions  . Amoxicillin-Pot Clavulanate Other (See Comments)    Real bad yeast infection    Physical Exam: BP 140/78   Pulse (!) 105   Temp (!) 97.5 F (36.4 C) (Temporal)   Resp 18   SpO2 96%    Physical Exam Constitutional:      Appearance: Normal appearance. She is obese.  HENT:     Head:     Comments: Eyes normal.  Ears normal.  Nose normal.  Pharynx normal. Neck:     Musculoskeletal: Neck supple.  Cardiovascular:     Comments: S1-S2 normal no murmurs Pulmonary:     Comments: Clear to percussion and auscultation Lymphadenopathy:     Cervical: No cervical adenopathy.  Neurological:     General: No focal deficit present.     Mental Status: She is alert and oriented to person, place, and time. Mental status is at baseline.  Psychiatric:        Mood and Affect: Mood normal.        Behavior: Behavior normal.        Thought Content: Thought content normal.        Judgment: Judgment normal.     Diagnostics: FVC 3.00 L FEV1 2.42 L.  Predicted FVC 3.33 L predicted FEV1 2.57 L-the spirometry is in  the normal range  Assessment and Plan: 1. Moderate persistent asthma without complication   2. Severe persistent asthma with acute exacerbation   3. Gastroesophageal reflux disease without esophagitis   4. Essential hypertension   5. Acute bronchitis due to other specified organisms     Meds ordered this encounter  Medications  . azithromycin (ZITHROMAX) 250 MG tablet    Sig: Take 2 tablets tonight then 1 tablet at night for the net 4 nights    Dispense:  6 each    Refill:  0    Patient Instructions  Allegra 180 mg-take 1 tablet once a day if needed for runny nose or itchy eyes Nasacort 1 spray per nostril twice a day for stuffy nose Ipratropium 0.03% - 2 sprays per nostril 3 times a day if needed for runny nose Allergy injections to grass and tree pollens every 4 weeks  Advair Diskus 500/50-1 puff every 12 hours to prevent coughing or wheezing.  Rinse gargle and spit out after use Montelukast 10 mg-take 1 tablet once a day to prevent coughing or wheezing Spiriva Respimat 1.25-2 puffs every 24 hours to prevent coughing or wheezing Ventolin 2 puffs every 4 hours if needed for wheezing  or coughing spells.  You may use Ventolin 2 puffs 5 to 15 minutes before exercise  Omeprazole 40 mg capsule-take 1 capsule twice a day to prevent reflux Zithromax 250 mg-take 2 tablets tonight, then 1 tablet at night for the next 4 nights Add prednisone 10 mg tablet to take 2 tablets twice a day for 3 days, 2 tablets on the fourth day, 1 tablet on the fifth day We will go ahead and apply for dupilumab instead of Fasenra  Continuing your other medications Call us if you are not doing well on this treatment plan   Return in about 3 months (around 06/03/2019).    Thank you for the opportunity to care for this patient.  Please do not hesitate to contact me with questions.  Penne Lash, M.D.  [facility] [address] [place], [place] [address] [phone]

## 2019-03-03 NOTE — Patient Instructions (Addendum)
Allegra 180 mg-take 1 tablet once a day if needed for runny nose or itchy eyes Nasacort 1 spray per nostril twice a day for stuffy nose Ipratropium 0.03% - 2 sprays per nostril 3 times a day if needed for runny nose Allergy injections to grass and tree pollens every 4 weeks  Advair Diskus 500/50-1 puff every 12 hours to prevent coughing or wheezing.  Rinse gargle and spit out after use Montelukast 10 mg-take 1 tablet once a day to prevent coughing or wheezing Spiriva Respimat 1.25-2 puffs every 24 hours to prevent coughing or wheezing Ventolin 2 puffs every 4 hours if needed for wheezing or coughing spells.  You may use Ventolin 2 puffs 5 to 15 minutes before exercise  Omeprazole 40 mg capsule-take 1 capsule twice a day to prevent reflux Zithromax 250 mg-take 2 tablets tonight, then 1 tablet at night for the next 4 nights Add prednisone 10 mg tablet to take 2 tablets twice a day for 3 days, 2 tablets on the fourth day, 1 tablet on the fifth day We will go ahead and apply for dupilumab instead of [place] your other medications Call us if you are not doing well on this treatment plan

## 2019-03-10 ENCOUNTER — Telehealth: Payer: Self-pay | Admitting: *Deleted

## 2019-03-10 NOTE — Telephone Encounter (Signed)
Called patient and discussed change in therapy from [place] to [place].  Advised will go ahead with approval and copay and let her know when we submit.  Advised upon arrival she should store Rx in fridge and contact clinic to bring in to get loading dose and instruction on self-admin if she feels like she can do same

## 2019-03-24 ENCOUNTER — Other Ambulatory Visit: Payer: Self-pay

## 2019-03-24 ENCOUNTER — Other Ambulatory Visit: Payer: Self-pay | Admitting: Pediatrics

## 2019-03-24 ENCOUNTER — Ambulatory Visit (INDEPENDENT_AMBULATORY_CARE_PROVIDER_SITE_OTHER): Payer: BC Managed Care – PPO

## 2019-03-24 DIAGNOSIS — J4550 Severe persistent asthma, uncomplicated: Secondary | ICD-10-CM

## 2019-03-24 MED ORDER — EPINEPHRINE 0.3 MG/0.3ML IJ SOAJ: INTRAMUSCULAR | 3 refills | Status: DC

## 2019-03-24 MED ORDER — DUPILUMAB 300 MG/2ML ~~LOC~~ SOSY
300.0000 mg | PREFILLED_SYRINGE | Freq: Once | SUBCUTANEOUS | Status: AC
  Administered 2019-03-24: 16:00:00 300 mg via SUBCUTANEOUS

## 2019-03-24 NOTE — Progress Notes (Signed)
Pt started Dupixent today. Loading dose 600 mg Q 2 weeks ( 300 mg given by me and 300 mg given by the patient). Pt will be getting the injection at home going forward. New rx for Auvi-Q sent to pharmacy.

## 2019-04-14 ENCOUNTER — Telehealth: Payer: Self-pay

## 2019-04-14 ENCOUNTER — Other Ambulatory Visit: Payer: Self-pay

## 2019-04-14 MED ORDER — AZITHROMYCIN 250 MG PO TABS: ORAL_TABLET | ORAL | 0 refills | Status: DC

## 2019-04-14 MED ORDER — PREDNISONE 10 MG PO TABS: ORAL_TABLET | ORAL | 0 refills | Status: DC

## 2019-04-14 NOTE — Telephone Encounter (Signed)
Sent in meds and left message for pt stating I sent them in for her and to call if she is not getting better

## 2019-04-14 NOTE — Telephone Encounter (Signed)
Patient is "coughing uncontrollably.  Coughing to where she is throwing up her lunch".  Slight wheeze but mostly coughing.  A lot of sinus drainage.  Wants to know if Dr. Shaune Leeks will call her in a ZPak and a prednisone pack.  Patient is taking all her medications as prescribed.  Especially her Omeprazole 40 mg, BID. Pharmacy is [facility] in [place]. Please advise.

## 2019-04-14 NOTE — Telephone Encounter (Signed)
Continue on her other medications.  Will call in Zithromax 250 mg-take 2 tablets tonight then 1 tablet at night for the next 4 nights and also prednisone 10 mg tablet to take 1 tablet twice a day for 4 days, 1 tablet on the fifth day.  Call us if she is not doing well on this treatment plan

## 2019-04-15 ENCOUNTER — Other Ambulatory Visit: Payer: Self-pay | Admitting: *Deleted

## 2019-04-15 MED ORDER — SPIRIVA RESPIMAT 1.25 MCG/ACT IN AERS: 2.0000 | INHALATION_SPRAY | Freq: Every day | RESPIRATORY_TRACT | 1 refills | Status: DC

## 2019-04-15 MED ORDER — IPRATROPIUM BROMIDE 0.03 % NA SOLN: 2.0000 | Freq: Two times a day (BID) | NASAL | 1 refills | Status: DC

## 2019-05-18 NOTE — Progress Notes (Signed)
Vial exp 05-17-20

## 2019-05-19 DIAGNOSIS — J301 Allergic rhinitis due to pollen: Secondary | ICD-10-CM

## 2019-05-22 ENCOUNTER — Other Ambulatory Visit: Payer: Self-pay | Admitting: Pediatrics

## 2019-05-22 MED ORDER — ALBUTEROL SULFATE (2.5 MG/3ML) 0.083% IN NEBU: INHALATION_SOLUTION | RESPIRATORY_TRACT | 2 refills | Status: DC

## 2019-05-22 NOTE — Telephone Encounter (Signed)
Patient is requesting a refill on her liquid nebulizer solution for her machine. [facility] in [place].

## 2019-05-22 NOTE — Telephone Encounter (Signed)
Medication sent.

## 2019-05-25 ENCOUNTER — Encounter: Payer: Self-pay | Admitting: Family Medicine

## 2019-05-25 ENCOUNTER — Ambulatory Visit (INDEPENDENT_AMBULATORY_CARE_PROVIDER_SITE_OTHER): Payer: BC Managed Care – PPO | Admitting: Family Medicine

## 2019-05-25 ENCOUNTER — Other Ambulatory Visit: Payer: Self-pay

## 2019-05-25 VITALS — BP 126/90 | HR 70 | Temp 98.3°F | Resp 16

## 2019-05-25 DIAGNOSIS — J309 Allergic rhinitis, unspecified: Secondary | ICD-10-CM | POA: Diagnosis not present

## 2019-05-25 DIAGNOSIS — Z20822 Contact with and (suspected) exposure to covid-19: Secondary | ICD-10-CM

## 2019-05-25 DIAGNOSIS — K219 Gastro-esophageal reflux disease without esophagitis: Secondary | ICD-10-CM | POA: Diagnosis not present

## 2019-05-25 DIAGNOSIS — J4550 Severe persistent asthma, uncomplicated: Secondary | ICD-10-CM | POA: Diagnosis not present

## 2019-05-25 DIAGNOSIS — Z20828 Contact with and (suspected) exposure to other viral communicable diseases: Secondary | ICD-10-CM | POA: Diagnosis not present

## 2019-05-25 NOTE — Patient Instructions (Addendum)
Asthma Continue Advair Diskus 500/50-1 puff every 12 hours to prevent coughing or wheezing.  Rinse gargle and spit out after use Continue montelukast 10 mg-take 1 tablet once a day to prevent coughing or wheezing Spiriva Respimat 1.25-2 puffs every 24 hours to prevent coughing or wheezing Ventolin 2 puffs every 4 hours if needed for wheezing or coughing spells.  You may use Ventolin 2 puffs 5 to 15 minutes before exercise Continue Flovent 110-2 puffs twice a day with a spacer for 2 weeks, then stop Continue Dupixent every other week   Allergic rhinitis Continue Allegra 180 mg-take 1 tablet once a day if needed for runny nose or itchy eyes Continue Nasacort 1 spray per nostril twice a day for stuffy nose Continue Ipratropium 0.03% - 2 sprays per nostril 3 times a day if needed for runny nose Allergy injections to grass and tree pollens once a week for the build up period, then once every 3 weeks.  Reflux Omeprazole 40 mg capsule-take 1 capsule twice a day to prevent reflux Zithromax 250 mg-take 2 tablets tonight, then 1 tablet at night for the next 4 nights  Cough Continue the treatment plan as listed above Order placed for COVID testing  Continue your other medications as listed in the chart   Call us if you are not doing well on this treatment plan  Follow up in 2 months or sooner if needed

## 2019-05-25 NOTE — Progress Notes (Addendum)
[address] [place] [address] Dept: [phone]  FOLLOW UP NOTE  Patient ID: Bridget Bell, female    DOB: 1961-07-09  Age: 58 y.o. MRN: [id number] Date of Office Visit: 05/25/2019  Assessment  Chief Complaint: Asthma  HPI Bridget Bell is a 58 year old female who presents to the clinic for an evaluation of cough and wheeze that began over the weekend. She reports that she has started to experience a cough over the weekend that has been producing clear mucus. She reports a slight wheeze with the cough. She denies shortness of breath with activity and rest. She continues Advair 500-1 puff twice a day, montelukast 10 mg once a day, Spiriva 2 puffs once a day, Flovent 110-2 puffs twice a day, albuterol 2 puffs in the morning and 2 puffs at night, and Dupixent injection every other week. She reports that allergic rhinitis has been well controlled over the last 4 weeks with the exception of this weekend when she began to experience post nasal drainage and clear productive cough. She continues Allegra, Nasacort, and ipratropium. She has not been using nasal saline rinses. She denies fever, sweats, chills, and body aches. Reflux is reported as well controlled with omeprazole once a day and dietary and lifestyle modifications. Her current medications are listed in the chart.    Drug Allergies:  Allergies  Allergen Reactions  . Amoxicillin-Pot Clavulanate Other (See Comments)    Real bad yeast infection    Physical Exam: BP 126/90   Pulse 70   Temp 98.3 F (36.8 C) (Oral)   Resp 16   SpO2 97%    Physical Exam Vitals signs reviewed.  Constitutional:      Appearance: Normal appearance.  HENT:     Head: Normocephalic and atraumatic.     Right Ear: Tympanic membrane normal.     Left Ear: Tympanic membrane normal.     Nose:     Comments: Bilateral nares slightly erythematous with clear nasal drainage noted. Pharynx slightly erythematous with no exudate noted. Ears normal. Eyes  normal. Eyes:     Conjunctiva/sclera: Conjunctivae normal.  Neck:     Musculoskeletal: Normal range of motion and neck supple.  Cardiovascular:     Rate and Rhythm: Normal rate and regular rhythm.     Heart sounds: Normal heart sounds. No murmur.  Pulmonary:     Effort: Pulmonary effort is normal.     Breath sounds: Normal breath sounds.     Comments: Lungs clear to auscultation Musculoskeletal: Normal range of motion.  Skin:    General: Skin is warm and dry.  Neurological:     Mental Status: She is alert and oriented to person, place, and time.  Psychiatric:        Mood and Affect: Mood normal.        Behavior: Behavior normal.        Thought Content: Thought content normal.        Judgment: Judgment normal.     Diagnostics: FVC 3.00, FEV1 2.48. Predicted FVC 3.33, predicted FEV1 2.57. Spirometry indicates normal ventilatory function.   Assessment and Plan: 1. Severe persistent asthma without complication   2. Allergic rhinitis, unspecified seasonality, unspecified trigger   3. Suspected 2019 novel coronavirus infection   4. Gastroesophageal reflux disease without esophagitis     Patient Instructions  Asthma Continue Advair Diskus 500/50-1 puff every 12 hours to prevent coughing or wheezing.  Rinse gargle and spit out after use Continue montelukast 10 mg-take 1  tablet once a day to prevent coughing or wheezing Spiriva Respimat 1.25-2 puffs every 24 hours to prevent coughing or wheezing Ventolin 2 puffs every 4 hours if needed for wheezing or coughing spells.  You may use Ventolin 2 puffs 5 to 15 minutes before exercise Continue Flovent 110-2 puffs twice a day with a spacer for 2 weeks, then stop Continue Dupixent every other week   Allergic rhinitis Continue Allegra 180 mg-take 1 tablet once a day if needed for runny nose or itchy eyes Continue Nasacort 1 spray per nostril twice a day for stuffy nose Continue Ipratropium 0.03% - 2 sprays per nostril 3 times a day if  needed for runny nose Allergy injections to grass and tree pollens once a week for the build up period, then once every 3 weeks.  Reflux Omeprazole 40 mg capsule-take 1 capsule twice a day to prevent reflux Zithromax 250 mg-take 2 tablets tonight, then 1 tablet at night for the next 4 nights  Cough Continue the treatment plan as listed above Order placed for COVID testing  Continue your other medications as listed in the chart   Call us if you are not doing well on this treatment plan  Follow up in 2 months or sooner if needed   Return in about 2 months (around 07/25/2019), or if symptoms worsen or fail to improve.    Thank you for the opportunity to care for this patient.  Please do not hesitate to contact me with questions.  Thermon Leyland, FNP [facility]  I have provided oversight concerning Thermon Leyland' evaluation and treatment of this patient's health issues addressed during today's encounter. I agree with the assessment and therapeutic plan as outlined in the note.   Thank you for the opportunity to care for this patient.  Please do not hesitate to contact me with questions.  Tonette Bihari, M.D.  [facility] [address] [place], [place] [address] [phone]

## 2019-05-25 NOTE — Progress Notes (Addendum)
Immunotherapy   Patient Details  Name: Zuriyah Shatz MRN: [id number] Date of Birth: 12-01-1960  05/25/2019  Cristy Folks here to pick up  [place] [place] Following schedule: C  Frequency:1 time per week while building up, then at [facility] may continue every 3 weeks.  Epi-Pen:Epi-Pen Available  Consent signed and patient instructions given. Patient was seen by A. Ambs, FNP due to cough, wheeze and sinus drainage. Per provider injection okay to give. Patient had no systemic or local reactions noted upon discharge.    Rosalio Loud 05/25/2019, 5:01 PM

## 2019-05-26 ENCOUNTER — Other Ambulatory Visit: Payer: Self-pay

## 2019-05-26 DIAGNOSIS — Z20822 Contact with and (suspected) exposure to covid-19: Secondary | ICD-10-CM

## 2019-05-27 ENCOUNTER — Encounter: Payer: Self-pay | Admitting: *Deleted

## 2019-05-27 LAB — NOVEL CORONAVIRUS, NAA: SARS-CoV-2, NAA: NOT DETECTED

## 2019-05-28 ENCOUNTER — Telehealth: Payer: Self-pay | Admitting: General Practice

## 2019-05-28 NOTE — Progress Notes (Signed)
Can you please call this patient and let her know that her COVID testing was negative. Thank you

## 2019-05-28 NOTE — Telephone Encounter (Signed)
Gave patient negative covid test results Patient understood 

## 2019-06-01 ENCOUNTER — Telehealth: Payer: Self-pay | Admitting: *Deleted

## 2019-06-01 MED ORDER — PREDNISONE 10 MG PO TABS: ORAL_TABLET | ORAL | 0 refills | Status: DC

## 2019-06-01 NOTE — Telephone Encounter (Signed)
Pt called stating she is not any better since last office visit 05/25/19. She still has a "croupy cough" Her COVID testing was negative. She is calling to see if she needs an antibiotic or steroid. Please advise.

## 2019-06-01 NOTE — Telephone Encounter (Signed)
Can you please order a day pack prednisone taper for her. Please make sure she is using her Advair, montelukast, Spiriva, montelukast, and albuterol. Also please have her start nasal saline rinses in addition to her nasal steroid and antihistamine. Thank you. Follow up in 1 month please

## 2019-06-01 NOTE — Telephone Encounter (Signed)
Pt informed. Prednisone sent.

## 2019-07-01 ENCOUNTER — Telehealth: Payer: Self-pay | Admitting: *Deleted

## 2019-07-01 NOTE — Telephone Encounter (Signed)
Thank you :)

## 2019-07-01 NOTE — Telephone Encounter (Signed)
Continue on all her medications.  She should be tested for COVID-19

## 2019-07-01 NOTE — Telephone Encounter (Signed)
Informed pt of this she will get tested tomorrow

## 2019-07-01 NOTE — Telephone Encounter (Signed)
Pt called complaining of wheeze/ cough since Sunday. Thinks she has a head cold, she "hurts all over" (has aches and pains) she does have arthritis, she said even her skin hurts, she has headache, her nasal wash helped, she is fatigued- this started today. She started using her Flovent, using Advair, Spiriva, and nebulizer treatments, allegra, Nasacort, ipratropium. She said her cough/wheeze is not that bad right now because she has been using her inhalers but is concerned about her "head cold". Wants advise on what to do.

## 2019-07-02 NOTE — Telephone Encounter (Signed)
PT called back to let us know she tested positive for covid. She was advised by health dept to quarantine for 10 days and for her live-in family members to quarantine for 14 days.

## 2019-07-04 ENCOUNTER — Telehealth: Payer: Self-pay | Admitting: Allergy

## 2019-07-04 MED ORDER — IPRATROPIUM-ALBUTEROL 0.5-2.5 (3) MG/3ML IN SOLN: 3.0000 mL | RESPIRATORY_TRACT | 0 refills | Status: DC | PRN

## 2019-07-04 MED ORDER — DEXAMETHASONE 4 MG PO TABS: 4.0000 mg | ORAL_TABLET | Freq: Every day | ORAL | 0 refills | Status: DC

## 2019-07-04 MED ORDER — BUDESONIDE 0.5 MG/2ML IN SUSP: 0.5000 mg | Freq: Three times a day (TID) | RESPIRATORY_TRACT | 0 refills | Status: DC

## 2019-07-04 NOTE — Telephone Encounter (Signed)
Please call patient and follow up on how she is doing on Monday. Thank you.   Patient called stating that it hurts to breathe in her inhalers.   Patient was diagnosed with COVID-19, she is having body aches. She is having coughing, wheezing and shortness of breath.  She is using her albuterol nebulizer every 4 hours and sometimes even before with minimal benefit. No pulse ox at home. Patient's husband has covid too but doing better. She is speaking in full sentences via the telephone.  Please see mychart message sent to patient for recommendation.

## 2019-07-05 NOTE — Telephone Encounter (Signed)
Please check with insurance about getting budesonide covered or getting a PA? If still not covered then she can try to use [email or web address] discount.  Patient called stating that the budesonide nebulizer was not covered by the insurance.  She used the duoneb nebulizer every 4 hours with minimal benefit. This was making her jittery.  She did pick up the dexamethasone tablet. She is taking vitamin C and D. Excedrin has aspirin already in it.  Advised to get pulse oximetry and monitor oxygen level.  If not feeling well go to ER.  She is speaking in full sentences and some coughing during conversation.

## 2019-07-06 ENCOUNTER — Telehealth: Payer: Self-pay | Admitting: Allergy

## 2019-07-06 ENCOUNTER — Ambulatory Visit: Payer: Self-pay | Admitting: Pediatrics

## 2019-07-06 NOTE — Telephone Encounter (Signed)
PT called says bcbs denied budesonide and need PA. PT covid is getting worse and needs the budesonide today. PT feels shaky, weak and nauseaus and is thinking about admitting herself to the hospital but wants to try this medication first.

## 2019-07-06 NOTE — Telephone Encounter (Signed)
Patient insurance [employer] is calling to find out why we are doing Budesonide 2 mL TID. FDA requires only BID and insurance needs documentation or acknowlegment from the provider as to why she needs it TID. The only way to get this approved is doing Budesonide  BID.  Please advise if this is the route you want to take and prescribe BID. Insurance call back personnel is Cristie Hem and her number is [phone]. The insurance is stating patient is needing this medication but they wont approve this?

## 2019-07-06 NOTE — Telephone Encounter (Signed)
Spoke with Bridget Bell and will approve pulmicort nebulizer 0.5mg  TID dose for 1 month.  I did tell her this is for a short term use due to her current asthma exacerbation due to [place].

## 2019-07-06 NOTE — Telephone Encounter (Signed)
Please see phone encounter from 07/04/2019 - get PA for budesonide or she can use goodrx discount.   Please call patient and see how she is doing. If she is not doing well and thins it's worsening then have her go to ER.  Also, she may be a candidate to receive monoclonal antibody infusion for the COVID-19 but she needs to be screened first.  Please call [phone] and give them patient's information. They will do the screening for the infusion. Thank you.

## 2019-07-06 NOTE — Telephone Encounter (Signed)
I wrote the prescription for TID dose as she is on high dose Advair 500 1 puff BID and Flovent 110 2 puffs BID but unable to do these inhalers at this time due to her COVID diagnosis and inability to take deep breaths required with the handheld inhalers.  Even with the pulmicort 0.5mg  neb TID dose the total 24 hour dose would 1.5 mg which is not even the maximum dose for the adults.   If this is still an issue then change script to BID dose.

## 2019-07-06 NOTE — Telephone Encounter (Signed)
PA initiated through [email or web address] pending approval 

## 2019-07-06 NOTE — Telephone Encounter (Signed)
Provider is speaking with insurance carrier to find out why she cannot get her medication

## 2019-07-07 NOTE — Telephone Encounter (Signed)
Called patient and left voicemail asking to return call to inform.

## 2019-07-08 NOTE — Telephone Encounter (Signed)
Patient informed of medication approval and of short term use.

## 2019-07-12 ENCOUNTER — Telehealth: Payer: Self-pay | Admitting: Allergy & Immunology

## 2019-07-12 MED ORDER — DEXAMETHASONE 4 MG PO TABS: 16.0000 mg | ORAL_TABLET | Freq: Every day | ORAL | 0 refills | Status: AC

## 2019-07-12 MED ORDER — AMOXICILLIN-POT CLAVULANATE 875-125 MG PO TABS: 1.0000 | ORAL_TABLET | Freq: Two times a day (BID) | ORAL | 0 refills | Status: DC

## 2019-07-12 NOTE — Telephone Encounter (Signed)
Patient called to report that she was having marked sinus pain and headache. She was diagnosed with COVID last week and given prednisone and azithromycin by the ED. She thinks that her breathing is improved, but she sinuses are worse. She is taking around the clock Excedrin with caffeine. She is also using Mucinex as well as nasal saline rinses and increased hydration.   Reviewed chart. Dr. Maudie Mercury already sent in a course of Decadron. Patient has completed that and apparently got a course of prednisone from the ED. I asked her about monoclonal antibodies and the ED did not feel that she met the criteria per the algorithm.   Because of the continued sinus pain, I sent in a course of Augmentin as well as a two course prescription for high dose Decadron. Patient will take one dose today and a second on Tuesday.   Salvatore Marvel, MD [facility] [place] [facility]

## 2019-07-13 NOTE — Telephone Encounter (Signed)
Pt said the nasal wash was causing the headaches, the sinus pressure is still there. She does not want to do sinus washes for awhile cause of this. She had her son pick up the medications yesterday. She does feel nauseous.

## 2019-07-15 NOTE — Telephone Encounter (Signed)
Thanks for the update.  I will route to Dr. Maudie Mercury to keep her in the loop.  Salvatore Marvel, MD [facility] [place] [facility] [place]

## 2019-07-27 ENCOUNTER — Telehealth: Payer: Self-pay

## 2019-07-27 NOTE — Telephone Encounter (Signed)
I agree with Dr. Leonard Schwartz. Unfortunately, the clinical effects of COVID are long lasting in many patients. Without the chills/fever/etc, I think there is a low risk of a pneumonia.   Malachi Bonds, MD [facility] [place]

## 2019-07-27 NOTE — Telephone Encounter (Signed)
Do you think I should send her for cxr?

## 2019-07-27 NOTE — Telephone Encounter (Signed)
Patient informed of this information. She says that she depends on the nebulized medications because she notices her coughing is worse when after a couple of hours without the nebulizer treatments. She says that she has pretty severe headaches when she does the saline rinses a lot.

## 2019-07-27 NOTE — Telephone Encounter (Signed)
Patient called because she is having an ongoing sinus/lung infection. She is still very short of breath, feels like she is unable to get a deep enough breath to even use her inhalers. She is currently using nebulized medications in place of these. Coughing is now mainly at night. She does notice it is painful to take a deep breath. She has finished all of the prescribed steroids and antibiotics. She has a nonproductive cough. Nasal drainage is bloody and yellow to green at times. She denies fever, body aches, chills, etc. Please advise and thank you.

## 2019-07-28 NOTE — Telephone Encounter (Signed)
Thank you :)

## 2019-08-04 ENCOUNTER — Telehealth: Payer: Self-pay | Admitting: Family Medicine

## 2019-08-04 NOTE — Telephone Encounter (Signed)
PT called to get our fax # . She will be having her employer fax over FMLA forms to be filled out by [place]. Gave fax #

## 2019-08-17 ENCOUNTER — Ambulatory Visit: Payer: BC Managed Care – PPO | Admitting: Pediatrics

## 2019-09-15 ENCOUNTER — Telehealth: Payer: Self-pay | Admitting: Family Medicine

## 2019-09-15 NOTE — Telephone Encounter (Signed)
For getting the covid vaccine after having covid, the CDC recommends for patients to be symptom free with no fever and 90 days past active infection or 90 days past receiving monoclonal antibodies. She should keep her appointment for the vaccine.

## 2019-09-15 NOTE — Telephone Encounter (Signed)
Informed pt of what anne stated, she is keeping the appointment and grateful for our insight on this matter

## 2019-09-15 NOTE — Telephone Encounter (Signed)
PT scheduled to get covid vaccine this Saturday and wants to know what Dr Beaulah Dinning and [name]'s thoughts on getting the vaccine. She was told by a friend to confirm that she has to be at least 12 weeks past covid infection to get covid vaccine. Please call pt back.  Ph [phone]

## 2019-09-15 NOTE — Telephone Encounter (Signed)
Please advise to after covid length of time to get vaccine

## 2019-09-28 ENCOUNTER — Other Ambulatory Visit: Payer: Self-pay

## 2019-09-28 MED ORDER — ALBUTEROL SULFATE HFA 108 (90 BASE) MCG/ACT IN AERS: 2.0000 | INHALATION_SPRAY | RESPIRATORY_TRACT | 1 refills | Status: DC | PRN

## 2019-09-28 NOTE — Telephone Encounter (Signed)
Prescription refill has been sent to the requesting pharmacy.

## 2019-10-02 ENCOUNTER — Other Ambulatory Visit: Payer: Self-pay | Admitting: Pediatrics

## 2019-10-24 ENCOUNTER — Other Ambulatory Visit: Payer: Self-pay | Admitting: Family Medicine

## 2019-11-26 ENCOUNTER — Other Ambulatory Visit: Payer: Self-pay | Admitting: Family Medicine

## 2019-12-09 ENCOUNTER — Other Ambulatory Visit: Payer: Self-pay

## 2019-12-09 ENCOUNTER — Encounter: Payer: Self-pay | Admitting: Family Medicine

## 2019-12-09 ENCOUNTER — Telehealth: Payer: Self-pay

## 2019-12-09 ENCOUNTER — Ambulatory Visit: Payer: BC Managed Care – PPO | Admitting: Family Medicine

## 2019-12-09 VITALS — BP 128/84 | HR 104 | Temp 98.5°F | Resp 16 | Ht 63.0 in | Wt 224.4 lb

## 2019-12-09 DIAGNOSIS — K219 Gastro-esophageal reflux disease without esophagitis: Secondary | ICD-10-CM | POA: Diagnosis not present

## 2019-12-09 DIAGNOSIS — R05 Cough: Secondary | ICD-10-CM | POA: Diagnosis not present

## 2019-12-09 DIAGNOSIS — J3089 Other allergic rhinitis: Secondary | ICD-10-CM | POA: Diagnosis not present

## 2019-12-09 DIAGNOSIS — R053 Chronic cough: Secondary | ICD-10-CM

## 2019-12-09 DIAGNOSIS — J4550 Severe persistent asthma, uncomplicated: Secondary | ICD-10-CM

## 2019-12-09 DIAGNOSIS — J302 Other seasonal allergic rhinitis: Secondary | ICD-10-CM

## 2019-12-09 MED ORDER — ALBUTEROL SULFATE HFA 108 (90 BASE) MCG/ACT IN AERS: 2.0000 | INHALATION_SPRAY | RESPIRATORY_TRACT | 1 refills | Status: DC | PRN

## 2019-12-09 MED ORDER — MONTELUKAST SODIUM 10 MG PO TABS: ORAL_TABLET | ORAL | 1 refills | Status: DC

## 2019-12-09 MED ORDER — ADVAIR HFA 230-21 MCG/ACT IN AERO
2.0000 | INHALATION_SPRAY | Freq: Two times a day (BID) | RESPIRATORY_TRACT | 5 refills | Status: DC
Start: 2019-12-09 — End: 2019-12-10

## 2019-12-09 MED ORDER — CARBINOXAMINE MALEATE 6 MG PO TABS: 6.0000 mg | ORAL_TABLET | Freq: Two times a day (BID) | ORAL | 5 refills | Status: DC | PRN

## 2019-12-09 MED ORDER — EPINEPHRINE 0.3 MG/0.3ML IJ SOAJ: INTRAMUSCULAR | 1 refills | Status: DC

## 2019-12-09 NOTE — Telephone Encounter (Signed)
Left pt. A message we are sending in the auvi-q 0.3mg  and told pt. She can get on you tube to see how to use it or when she comes in for her dupixent she can ask the shot nurse to go over how to use the auvi q. Pt. Stated she wasn't going to pay 100.00 dollars if the epi pen 0.3mg  cost. I told her the auvi q is cheaper.

## 2019-12-09 NOTE — Progress Notes (Addendum)
[address] [place] [address] Dept: [phone]  FOLLOW UP NOTE  Patient ID: Bridget Bell, female    DOB: 11-06-1960  Age: 59 y.o. MRN: [id number] Date of Office Visit: 12/09/2019  Assessment  Chief Complaint: Allergic Rhinitis  and Asthma  HPI Bridget Bell is a 59 year old female who presents to the clinic for a follow up visit. She was last seen in the clinic on 05/25/2019 for evaluation of asthma, allergic rhinitis, and reflux.  In the interim, she tested positive for COVID in December 2020 and was evaluated in the ED. She also had an appointment with her primary care provider regarding leg swelling for which she took furosemide for several weeks with resolution of the swelling. At today's visit, she reports that she continues to experience chronic cough. She reports her asthma has been well controlled with infrequent shortness of breath and wheeze which occurs infrequently with activity. She reports cough occurs constantly and produces this clear secretions. She is currently taking montelukast 10 mg once a day, Advair 500-1 puff twice a day, Spiriva 2 puffs ance a day and using albuterol twice a day on a regular scheduled basis. She reports no change in her cough after changing from long term Symbicort use to Advair Diskus.  She has previously tried Symbicort, Flovent, Fasenra, Xolair, montelukast, and Dupixent for asthma control. Allergic rhinitis is reported as not well controlled with post nasal drainage as the main issue for which she is currently using Allegra once a day as well as ipratropium, and Nasacort. She has previously tried Nasonex and saline rinses for allergic rhinitis control. She continues allergen immunotherapy once a week on the buildup now and once every 3 weeks after the build up phase. She reports a significant decrease in her symptoms of allergic rhinitis while continuing allergen immunotherapy. She does report that she stopped taking allergen immunotherapy and  Dupixent for several weeks beginning in December when she had COVID and again beginning at the end of February. She reports that she restarted these therapies just one week ago. Reflux is reported as well controlled with no heartburn. She does report post-tussive vomiting occasionally after coughing vigorously. She continues omeprazole 40 mg twice a day. She has last visited ENT on 06/12/2018 with a recommendation to continue omeprazole for reflux. She has previously tried rantidine, Tums, and reduction of caffeine. Her current medications are listed in the chart.   Drug Allergies:  Allergies  Allergen Reactions  . Amoxicillin-Pot Clavulanate Other (See Comments)    Real bad yeast infection    Physical Exam: BP 128/84 (BP Location: Right Arm, Patient Position: Sitting, Cuff Size: Large)   Pulse (!) 104   Temp 98.5 F (36.9 C) (Oral)   Resp 16   Ht 5\' 3"  (1.6 m)   Wt 224 lb 6.9 oz (101.8 kg)   SpO2 98%   BMI 39.76 kg/m    Physical Exam Vitals reviewed.  Constitutional:      Appearance: Normal appearance.  HENT:     Head: Normocephalic and atraumatic.     Right Ear: Tympanic membrane normal.     Left Ear: Tympanic membrane normal.     Nose:     Comments: Bilateral nares erythematous with clear nasal drainage noted. Pharynx erythematous with no exudate. Ears normal. Eyes normal.    Mouth/Throat:     Pharynx: Oropharynx is clear.  Eyes:     Conjunctiva/sclera: Conjunctivae normal.  Cardiovascular:     Rate and Rhythm: Normal rate  and regular rhythm.     Heart sounds: Normal heart sounds. No murmur.  Pulmonary:     Effort: Pulmonary effort is normal.     Breath sounds: Normal breath sounds.     Comments: Lungs clear to auscultation Musculoskeletal:        General: Normal range of motion.     Cervical back: Normal range of motion and neck supple.  Skin:    General: Skin is warm and dry.  Neurological:     Mental Status: She is alert and oriented to person, place, and time.    Psychiatric:        Mood and Affect: Mood normal.        Behavior: Behavior normal.        Thought Content: Thought content normal.        Judgment: Judgment normal.    Diagnostics: FVC 3.14, FEV1 2.71.  Predicted FVC 3.16, predicted FEV1 2.43.  Spirometry indicates normal ventilatory function.  Assessment and Plan: 1. Severe persistent asthma without complication   2. Gastroesophageal reflux disease without esophagitis   3. Chronic cough   4. Seasonal and perennial allergic rhinitis     Meds ordered this encounter  Medications  . DISCONTD: montelukast (SINGULAIR) 10 MG tablet    Sig: Take 1 tablet once daily at night for coughing or wheezing.    Dispense:  90 tablet    Refill:  1    Patient needs appointment for further refills  . EPINEPHrine (AUVI-Q) 0.3 mg/0.3 mL IJ SOAJ injection    Sig: Use as directed for severe allergic reactions.    Dispense:  2 each    Refill:  1  . albuterol (VENTOLIN HFA) 108 (90 Base) MCG/ACT inhaler    Sig: Inhale 2 puffs into the lungs every 4 (four) hours as needed for wheezing or shortness of breath.    Dispense:  18 g    Refill:  1  . montelukast (SINGULAIR) 10 MG tablet    Sig: Take 1 tablet once daily at night for coughing or wheezing.    Dispense:  90 tablet    Refill:  1  . DISCONTD: fluticasone-salmeterol (ADVAIR HFA) 230-21 MCG/ACT inhaler    Sig: Inhale 2 puffs into the lungs 2 (two) times daily.    Dispense:  1 Inhaler    Refill:  5  . Carbinoxamine Maleate (RYVENT) 6 MG TABS    Sig: Take 6 mg by mouth 2 (two) times daily as needed.    Dispense:  120 tablet    Refill:  5    Patient Instructions  Asthma Stop Advair Diskus and begin Symbicort 160-2 puffs twice a day with a spacer hours to prevent coughing or wheezing.  Rinse gargle and spit out after use Continue montelukast 10 mg-take 1 tablet once a day to prevent coughing or wheezing Continue Spiriva Respimat 1.25-2 puffs every 24 hours to prevent coughing or  wheezing Continue Ventolin 2 puffs every 4 hours if needed for wheezing or coughing spells.  You may use Ventolin 2 puffs 5 to 15 minutes before exercise Continue Flovent 110-2 puffs twice a day with a spacer for 2 weeks, then stop Continue Dupixent injections every other week  Refer to pulmonology for evaluation and treatment  Allergic rhinitis Continue Allegra 180 mg once a day as needed for a runny nose Continue Nasacort 1 spray per nostril twice a day for stuffy nose Continue Ipratropium 0.03% - 2 sprays per nostril 3 times a day if needed  for runny nose Continue allergy injections to grass and tree pollens once a week for the build up period, then once every 3 weeks.  Reflux Begin famotidine 20 mg twice a day to control reflux Continue omeprazole 40 mg capsule-take 1 capsule twice a day to prevent reflux  Continue your other medications as listed in the chart   Call us if you are not doing well on this treatment plan  Follow up in 3 months or sooner if needed   Return in about 3 months (around 03/10/2020), or if symptoms worsen or fail to improve.    Thank you for the opportunity to care for this patient.  Please do not hesitate to contact me with questions.  Gareth Morgan, FNP Allergy and [place]  ________________________________________________  I have provided oversight concerning [facility]'s evaluation and treatment of this patient's health issues addressed during today's encounter.  I agree with the assessment and therapeutic plan as outlined in the note.   Signed,   R Edgar Frisk, MD

## 2019-12-09 NOTE — Patient Instructions (Addendum)
Asthma Continue Advair Diskus 1 puff every 12 hours to prevent coughing or wheezing.  Rinse gargle and spit out after use Continue montelukast 10 mg-take 1 tablet once a day to prevent coughing or wheezing Continue Spiriva Respimat 1.25-2 puffs every 24 hours to prevent coughing or wheezing Continue Ventolin 2 puffs every 4 hours if needed for wheezing or coughing spells.  You may use Ventolin 2 puffs 5 to 15 minutes before exercise Continue Flovent 110-2 puffs twice a day with a spacer for 2 weeks, then stop Continue Dupixent injections every other week  Refer to pulmonology for evaluation and treatment  Allergic rhinitis Continue Allegra 180 mg once a day as needed for a runny nose Continue Nasacort 1 spray per nostril twice a day for stuffy nose Continue Ipratropium 0.03% - 2 sprays per nostril 3 times a day if needed for runny nose Continue allergy injections to grass and tree pollens once a week for the build up period, then once every 3 weeks.  Reflux Begin famotidine 20 mg twice a day to control reflux Continue omeprazole 40 mg capsule-take 1 capsule twice a day to prevent reflux  Continue your other medications as listed in the chart   Call us if you are not doing well on this treatment plan  Follow up in 3 months or sooner if needed

## 2019-12-10 ENCOUNTER — Encounter: Payer: Self-pay | Admitting: Family Medicine

## 2019-12-10 NOTE — Telephone Encounter (Signed)
Patient reports that she has previously tried Ryvent with no relief of symptoms. She reports that she believes Allegra is providing adequate relief at this time. She is not interested in switching to an [facility] inhaler at this time and would prefer to continue Advair Diskus despite her continuous cough. She will call the clinic if she would like to change her inhaler or antihistamine.

## 2019-12-22 ENCOUNTER — Other Ambulatory Visit: Payer: Self-pay | Admitting: Family Medicine

## 2020-01-11 ENCOUNTER — Telehealth: Payer: Self-pay | Admitting: Family Medicine

## 2020-01-11 NOTE — Telephone Encounter (Signed)
PT called requesting to speak to [facility] about symbicort.

## 2020-01-12 MED ORDER — BUDESONIDE-FORMOTEROL FUMARATE 160-4.5 MCG/ACT IN AERO
2.0000 | INHALATION_SPRAY | Freq: Two times a day (BID) | RESPIRATORY_TRACT | 1 refills | Status: DC
Start: 2020-01-12 — End: 2020-05-20

## 2020-01-12 NOTE — Telephone Encounter (Signed)
Spoke with patient about Symbicort. She wanted to know if she should be on it. At last [facility] wanted her to stop Advair and begin Symbicort. Rx sent  to [facility] shipping. Patient informed.

## 2020-01-18 NOTE — Telephone Encounter (Signed)
PA SENT TO PLAN THRU COVER MY MEDS WAITING ON RESPONSE

## 2020-01-18 NOTE — Telephone Encounter (Signed)
PT states [employer] denied rx & is requestigng PA for symbicort. Please f/u with patient.

## 2020-02-08 ENCOUNTER — Telehealth: Payer: Self-pay

## 2020-02-08 ENCOUNTER — Other Ambulatory Visit: Payer: Self-pay | Admitting: Family Medicine

## 2020-02-08 MED ORDER — OMEPRAZOLE 40 MG PO CPDR: DELAYED_RELEASE_CAPSULE | ORAL | 1 refills | Status: DC

## 2020-02-08 NOTE — Telephone Encounter (Signed)
[name] pt. Wanted to let you know that she entered herself into a study for chronic cough. Pt. Hasn't been called yet. Pt. Also wanted to let you know her cough is a lot better since changing her to the symbicort 160/4.5 mcg

## 2020-02-08 NOTE — Telephone Encounter (Signed)
Patient is in need of oneprazole 3 month supply refill called into [facility] in [place]

## 2020-02-08 NOTE — Telephone Encounter (Signed)
Sent in pt.'s omeprazole 40 mg twice daily and 90 day supply. Pt. Aware.

## 2020-02-09 NOTE — Telephone Encounter (Signed)
That's great!! Thank you for keeping me in the loop!!

## 2020-02-21 ENCOUNTER — Other Ambulatory Visit: Payer: Self-pay | Admitting: Pediatrics

## 2020-03-24 NOTE — Patient Instructions (Addendum)
Severe persistent asthma Start prednisone 10 mg- take 2 tablets twice a day for 3 days, then on the fourth day take 2 tablets in the morning, and on the fifth day take 1 tablet and stop. Continue Symbicort 160-2 puffs twice a day with spacer to help prevent cough and wheeze Continue Spiriva Respimat 1.25-use 2 puffs once a day to help prevent cough and wheeze Continue montelukast 10 mg once a day to help prevent cough and wheeze Continue Dupixent injections every other week May use Ventolin 2 puffs every 4 hours as needed for cough, wheeze, tightness in chest, or shortness of breath Refer to pulmonary due to chronic cough  Seasonal and perennial allergic rhinitis Continue Allegra 180 mg once a day as needed for runny nose Continue Nasacort 1 spray each nostril twice a day as needed for stuffy nose Continue ipratropium nasal spray 0.03% 2 sprays each nostril up to 3 times a day as needed for runny nose or drainage.  Reflux Continue omeprazole 40 mg 1 capsule twice a day  Hoarseness Refer to ENT due to hoarseness and chronic cough  Please let us know if this treatment plan is not working well for you Schedule follow-up appointment in 2 months

## 2020-03-25 ENCOUNTER — Encounter: Payer: Self-pay | Admitting: Family

## 2020-03-25 ENCOUNTER — Ambulatory Visit (INDEPENDENT_AMBULATORY_CARE_PROVIDER_SITE_OTHER): Payer: BC Managed Care – PPO | Admitting: Family

## 2020-03-25 ENCOUNTER — Other Ambulatory Visit: Payer: Self-pay

## 2020-03-25 VITALS — BP 128/74 | HR 104 | Temp 98.2°F | Resp 16 | Ht 63.0 in | Wt 224.0 lb

## 2020-03-25 DIAGNOSIS — R05 Cough: Secondary | ICD-10-CM | POA: Diagnosis not present

## 2020-03-25 DIAGNOSIS — J302 Other seasonal allergic rhinitis: Secondary | ICD-10-CM

## 2020-03-25 DIAGNOSIS — J3089 Other allergic rhinitis: Secondary | ICD-10-CM

## 2020-03-25 DIAGNOSIS — R053 Chronic cough: Secondary | ICD-10-CM

## 2020-03-25 DIAGNOSIS — R490 Dysphonia: Secondary | ICD-10-CM

## 2020-03-25 DIAGNOSIS — J4551 Severe persistent asthma with (acute) exacerbation: Secondary | ICD-10-CM

## 2020-03-25 DIAGNOSIS — K219 Gastro-esophageal reflux disease without esophagitis: Secondary | ICD-10-CM | POA: Diagnosis not present

## 2020-03-25 MED ORDER — ALBUTEROL SULFATE HFA 108 (90 BASE) MCG/ACT IN AERS: 2.0000 | INHALATION_SPRAY | RESPIRATORY_TRACT | 0 refills | Status: DC | PRN

## 2020-03-25 NOTE — Progress Notes (Signed)
[address] [place] [address] Dept: [phone]  FOLLOW UP NOTE  Patient ID: Bridget Bell, female    DOB: January 27, 1961  Age: 59 y.o. MRN: [id number] Date of Office Visit: 03/25/2020  Assessment  Chief Complaint: Shortness of Breath and Asthma  HPI Bridget Bell is a 59 year old female who presents today for an acute visit.  She was last seen on Dec 09, 2019 by Thermon Leyland, FNP for severe persistent asthma, gastroesophageal reflux disease, chronic cough, and seasonal and perennial allergic rhinitis.  Severe persistent asthma is reported as poorly controlled with Symbicort 160/4.5 mcg 2 puffs twice a day with spacer, montelukast 10 mg once a day, Spiriva Respimat 1.25 mcg 2 puffs once a day, Dupixent injections every other week, and Ventolin as needed.  She reports that on August 6 or 7 her coughing became worse.  Her cough is at times dry to productive.  What she is able to cough up is clear in color. She denies any fever or chills. She also has post tussive vomiting.  She also has wheezing, tightness in her chest, and shortness of breath.  She has not required any trips to the emergency room or urgent care since her last office visit due to breathing problems and has not received any systemic steroids.  She is having to use her rescue inhaler 3-4 times a day and her nebulizer once a day since August 6.  She reports that she never received a phone call for a referral to pulmonary from our office.  She also previously signed up for chronic cough study and was never called.  She reports this cough has been ongoing for 4 to 5 years.  On March 14, 2020 she saw her primary care physician for this cough and sore throat.  At that visit her COVID-19 test was negative and her rapid strep test was negative.  She was not given any medication.  After reviewing her chart she has previously tried Advair 500/50, Dulera, Flovent, Fasenra injections, and Xolair injections for her asthma.  Seasonal and  perennial allergic rhinitis is reported as moderately controlled. She reports occasional nasal congestion and clear rhinorrhea for which Allegra helps.  She also has a small amount of postnasal drip.  She is currently taking Allegra 180 mg once a day, Nasacort 1 spray each nostril twice a day, and ipratropium 0.03 % 1 spray each nostril twice a day.  She reports if she uses 2 sprays  Of the ipratropium nasal spray it will cause her nose to bleed.  She has stopped taking her allergy injections due to her cough.  Reflux is reported as controlled with omeprazole 40 mg twice a day.  Hoarseness is reported as an ongoing problem.  She reports that she saw ENT approximately 2 to 3 years ago and was told" that she had calluses on the middle third of her vocal cord."  Current medications are as listed in the chart.  Drug Allergies:  Allergies  Allergen Reactions   Amoxicillin-Pot Clavulanate Other (See Comments)    Real bad yeast infection    Review of Systems: Review of Systems  Constitutional: Negative for chills and fever.  HENT: Positive for congestion.        Reports post nasal drip and occasional clear rhinorrhea  Eyes:       Denies itchy watery eyes  Respiratory: Positive for cough, shortness of breath and wheezing.   Cardiovascular: Positive for palpitations. Negative for chest pain.  Reports history of palpitations. Has seen primary care and cardiologist  Gastrointestinal: Positive for constipation. Negative for heartburn.  Genitourinary: Negative for dysuria.  Skin: Negative for itching and rash.  Neurological: Negative for headaches.  Endo/Heme/Allergies: Positive for environmental allergies.     Physical Exam: BP 128/74    Pulse (!) 104    Temp 98.2 F (36.8 C) (Oral)    Resp 16    Ht 5\' 3"  (1.6 m)    Wt 224 lb (101.6 kg)    SpO2 99%    BMI 39.68 kg/m    Physical Exam Constitutional:      Appearance: She is well-developed.  HENT:     Head: Normocephalic and  atraumatic.     Comments: Pharynx normal. Eyes normal. Ears normal. Nose normal    Mouth/Throat:     Mouth: Mucous membranes are moist.     Pharynx: Oropharynx is clear.  Eyes:     Comments: Conjunctiva clear  Cardiovascular:     Rate and Rhythm: Regular rhythm.     Heart sounds: Normal heart sounds.  Pulmonary:     Effort: Pulmonary effort is normal.     Breath sounds: Normal breath sounds.     Comments: Lungs clear to auscultation Musculoskeletal:     Cervical back: Neck supple.  Skin:    General: Skin is warm.  Neurological:     Mental Status: She is alert and oriented to person, place, and time.  Psychiatric:        Mood and Affect: Mood normal.        Behavior: Behavior normal.     Diagnostics: FVC 2.68 L, FEV1 2.48 L.  Predicted FVC 3.16 L, FEV1 2.43 L.  Spirometry indicates normal ventilatory function.  Assessment and Plan: 1. Severe persistent asthma with acute exacerbation   2. Seasonal and perennial allergic rhinitis   3. Chronic cough   4. Gastroesophageal reflux disease without esophagitis   5. Hoarseness of voice     No orders of the defined types were placed in this encounter.   Patient Instructions  Severe persistent asthma Start prednisone 10 mg- take 2 tablets twice a day for 3 days, then on the fourth day take 2 tablets in the morning, and on the fifth day take 1 tablet and stop. Continue Symbicort 160-2 puffs twice a day with spacer to help prevent cough and wheeze Continue Spiriva Respimat 1.25-use 2 puffs once a day to help prevent cough and wheeze Continue montelukast 10 mg once a day to help prevent cough and wheeze Continue Dupixent injections every other week May use Ventolin 2 puffs every 4 hours as needed for cough, wheeze, tightness in chest, or shortness of breath Refer to pulmonary due to chronic cough  Seasonal and perennial allergic rhinitis Continue Allegra 180 mg once a day as needed for runny nose Continue Nasacort 1 spray each  nostril twice a day as needed for stuffy nose Continue ipratropium nasal spray 0.03% 2 sprays each nostril up to 3 times a day as needed for runny nose or drainage.  Reflux Continue omeprazole 40 mg 1 capsule twice a day  Hoarseness Refer to ENT due to hoarseness and chronic cough  Please let June know if this treatment plan is not working well for you Schedule follow-up appointment in 2 months    Return in about 2 months (around 05/25/2020), or if symptoms worsen or fail to improve.    Thank you for the opportunity to care for this patient.  Please do not hesitate to contact me with questions.  Althea Charon, FNP Allergy and [place] [facility] [place]

## 2020-03-29 ENCOUNTER — Telehealth: Payer: Self-pay | Admitting: *Deleted

## 2020-03-29 DIAGNOSIS — R059 Cough, unspecified: Secondary | ICD-10-CM

## 2020-03-29 NOTE — Telephone Encounter (Signed)
CXR ordered for [facility] Order has been faxed and pt informed and also reviewed Chrissie note.

## 2020-03-29 NOTE — Telephone Encounter (Signed)
Bridget Bell called and stated that her coughing has not improved. The mucinex has helped to get the phlegm up and she states it is green. The green phlegm started Friday evening/saturday morning. She denied having any fever, chills, or body aches. She told me that she is using Symbicort, Proair, Flovent, Spiriva, albuterol treatments and budesonide treatments. I did explain that she should only be using the inhalers listed on her AVS. She was wondering if she neeDS either an Abx or CXR.

## 2020-03-29 NOTE — Telephone Encounter (Signed)
Please have her go get a PA/lateral chest x-ray due to cough. Please have her stop the budesonide. She does not need to be doing the budesonide along with her Symbicort, Proair, Flovent, and Spiriva. When she adds on her Flovent 110 mcg due to asthma flares make sure she does 2 puffs twice a day with spacer for 2 weeks then stop.   Thank you!

## 2020-03-30 ENCOUNTER — Telehealth: Payer: Self-pay | Admitting: *Deleted

## 2020-03-30 ENCOUNTER — Other Ambulatory Visit: Payer: Self-pay

## 2020-03-30 MED ORDER — BENZONATATE 100 MG PO CAPS: ORAL_CAPSULE | ORAL | 0 refills | Status: DC

## 2020-03-30 NOTE — Telephone Encounter (Signed)
She can have tessalone perles  100 mg capsule every 8 hours as needed for cough. Quantity 20 with no refills or she could try over the counter Delsym.  Thank you!

## 2020-03-30 NOTE — Telephone Encounter (Signed)
Hey Bridget Bell I have the results where would you like me to fax them too?

## 2020-03-30 NOTE — Telephone Encounter (Signed)
Called and informed patient of chest x-ray results. Per Chrissie her chest x-ray was normal. Patient states she's still coughing up green phlegm and it hurts to cough. Patient is thinking she might need round of antibiotics. Please advise.

## 2020-03-30 NOTE — Telephone Encounter (Signed)
Per Chrissie from pt appt 03/25/20   -Refer to pulmonary due to chronic cough. -Refer to ENT due to hoarseness and chronic cough.

## 2020-03-30 NOTE — Telephone Encounter (Signed)
Informed pt of [place] NOT sending in an antibiotic she would like something to help as she is taking all current meds and still coughing. She said she has been like this since the beginning of aug and it is just getting worse.

## 2020-03-30 NOTE — Telephone Encounter (Signed)
Spoke to [place] she will be working on making a referral to an ENT and Pulmonologist for this pt.

## 2020-03-30 NOTE — Telephone Encounter (Signed)
Sent in tessalone perles 100 mg take 1 every 8 hours for cough. Number 20 with no refills. If not helping can use delsym. Did instruct pt. To call Friday morning if no better. Will call dee to get her referred out to a pulmonologist per Chrissie.

## 2020-03-30 NOTE — Telephone Encounter (Signed)
Can you please fax to [facility]

## 2020-03-30 NOTE — Telephone Encounter (Signed)
Please let her know that she does not need an antibiotic at this point with her chest xray being normal.

## 2020-03-31 NOTE — Telephone Encounter (Signed)
Patient is scheduled to see Helene Kelp, PA @ [facility]- [facility]. On 04-25-2020 @ 4:00. Patients referral has been placed to [facility] ENT for review as they would not allow me to schedule the patients appointment.   Patient has been informed of this information.   Thanks

## 2020-03-31 NOTE — Telephone Encounter (Signed)
Thank you :)

## 2020-05-11 ENCOUNTER — Other Ambulatory Visit: Payer: Self-pay

## 2020-05-11 ENCOUNTER — Encounter: Payer: Self-pay | Admitting: Family Medicine

## 2020-05-11 ENCOUNTER — Ambulatory Visit: Payer: BC Managed Care – PPO | Admitting: Family Medicine

## 2020-05-11 VITALS — BP 116/84 | HR 100 | Temp 98.1°F | Resp 16

## 2020-05-11 DIAGNOSIS — J4551 Severe persistent asthma with (acute) exacerbation: Secondary | ICD-10-CM | POA: Diagnosis not present

## 2020-05-11 DIAGNOSIS — R053 Chronic cough: Secondary | ICD-10-CM | POA: Diagnosis not present

## 2020-05-11 DIAGNOSIS — J3089 Other allergic rhinitis: Secondary | ICD-10-CM

## 2020-05-11 DIAGNOSIS — R490 Dysphonia: Secondary | ICD-10-CM

## 2020-05-11 DIAGNOSIS — K219 Gastro-esophageal reflux disease without esophagitis: Secondary | ICD-10-CM

## 2020-05-11 DIAGNOSIS — J302 Other seasonal allergic rhinitis: Secondary | ICD-10-CM

## 2020-05-11 NOTE — Patient Instructions (Signed)
Severe persistent asthma Continue Symbicort 160-2 puffs twice a day with spacer to help prevent cough and wheeze Continue Spiriva Respimat 1.25-use 2 puffs once a day to help prevent cough and wheeze. Do not use a spacer with this inhaler Continue montelukast 10 mg once a day to help prevent cough and wheeze Continue Dupixent injections every other week May use Ventolin 2 puffs every 4 hours as needed for cough, wheeze, tightness in chest, or shortness of breath For asthma flare, begin Flovent 110-2 puffs twice a day with a spacer for 2 weeks or until cough and wheeze free Continue to follow up with Dr. Su Monks as scheduled  Seasonal and perennial allergic rhinitis Continue Allegra 180 mg once a day as needed for runny nose Continue Nasacort 1 spray each nostril twice a day as needed for stuffy nose Continue ipratropium nasal spray 0.03% 2 sprays each nostril up to 3 times a day as needed for runny nose or drainage. Consider saline nasal rinses as needed for nasal symptoms. Use this before any medicated nasal sprays for best result  Reflux Continue omeprazole 40 mg 1 capsule twice a day Continue dietary and lifestyle modifications as listed below  Call the clinic if this treatment plan is not working well for you  Follow up in 4 months or sooner if needed.   Lifestyle Changes for Controlling GERD When you have GERD, stomach acid feels as if it's backing up toward your mouth. Whether or not you take medication to control your GERD, your symptoms can often be improved with lifestyle changes.   Raise Your Head  Reflux is more likely to strike when you're lying down flat, because stomach fluid can  flow backward more easily. Raising the head of your bed 4-6 inches can help. To do this:  Slide blocks or books under the legs at the head of your bed. Or, place a wedge under  the mattress. Many foam stores can make a suitable wedge for you. The wedge  should run from your waist to the top  of your head.  Don't just prop your head on several pillows. This increases pressure on your  stomach. It can make GERD worse.  Watch Your Eating Habits Certain foods may increase the acid in your stomach or relax the lower esophageal sphincter, making GERD more likely. It's best to avoid the following:  Coffee, tea, and carbonated drinks (with and without caffeine)  Fatty, fried, or spicy food  Mint, chocolate, onions, and tomatoes  Any other foods that seem to irritate your stomach or cause you pain  Relieve the Pressure  Eat smaller meals, even if you have to eat more often.  Don't lie down right after you eat. Wait a few hours for your stomach to empty.  Avoid tight belts and tight-fitting clothes.  Lose excess weight.  Tobacco and Alcohol  Avoid smoking tobacco and drinking alcohol. They can make GERD symptoms worse.

## 2020-05-11 NOTE — Progress Notes (Addendum)
[address] [place]  [address] Dept: [phone]  FOLLOW UP NOTE  Patient ID: Bridget Bell, female    DOB: 04-08-1961  Age: 59 y.o. MRN: [id number] Date of Office Visit: 05/11/2020  Assessment  Chief Complaint: Wheezing and Asthma  HPI Bridget Bell is a 59 year old female who presents to the clinic for a follow-up visit.  She was last seen in this clinic on 03/25/2020 by Nehemiah Settle, FNP, for evaluation of asthma, allergic rhinitis, reflux, and hoarseness.  In the interim, she visited Dr. Verne Spurr, ENT, as well as Helene Kelp, pulmonary specialist for evaluation of chronic cough. At today's visit, she reports her asthma has been moderately well controlled with no shortness of breath with activity or rest, slight intermittent wheeze, and cough which is occasionally dry and occasionally producing some clear to yellow mucus.  She continues Symbicort 160-2 puffs twice a day with a spacer, Spiriva 1.25 mcg 2 puffs once a day, montelukast 10 mg once a day, and is frequently using albuterol.  She continues Dupixent subcutaneous injections once every 14 days with no large local reactions.  She reports that while continuing Dupixent, her asthma symptoms have improved.  She has been using a spacer with Spiriva.  She has been using albuterol on a regular schedule instead of on an as-needed basis.  We have previously talked about using albuterol as needed instead of on a regular scheduled regimen.  Allergic rhinitis is reported as poorly controlled intermittent with clear rhinorrhea, nasal congestion, and postnasal drainage.  She continues Nasacort, ipratropium nasal spray, and Mucinex daily.  She does report some use of saline nasal rinses, however, she does not use these daily.  She continues omeprazole 40 mg twice a day for reflux control.  She denies heartburn, however, continues to experience chronic cough.  She does report occasional posttussive vomiting.  She reports that she is very open to  speech therapy.  Her current medications are listed in the chart.   Drug Allergies:  Allergies  Allergen Reactions  . Amoxicillin-Pot Clavulanate Other (See Comments)    Real bad yeast infection    Physical Exam: BP 116/84   Pulse 100   Temp 98.1 F (36.7 C) (Tympanic)   Resp 16   SpO2 98%    Physical Exam Vitals reviewed.  Constitutional:      Appearance: Normal appearance.  HENT:     Head: Normocephalic and atraumatic.     Right Ear: Tympanic membrane normal.     Left Ear: Tympanic membrane normal.     Nose:     Comments: Bilateral nares slightly erythematous with no nasal drainage noted.  Pharynx erythematous with no exudate.  Ears normal.  Eyes normal. Eyes:     Conjunctiva/sclera: Conjunctivae normal.  Cardiovascular:     Rate and Rhythm: Normal rate and regular rhythm.     Heart sounds: Normal heart sounds. No murmur heard.   Pulmonary:     Effort: Pulmonary effort is normal.     Breath sounds: Normal breath sounds.     Comments: Lungs clear to auscultation Musculoskeletal:        General: Normal range of motion.     Cervical back: Normal range of motion and neck supple.  Skin:    General: Skin is warm and dry.  Neurological:     Mental Status: She is alert and oriented to person, place, and time.  Psychiatric:        Mood and Affect: Mood normal.  Behavior: Behavior normal.        Thought Content: Thought content normal.        Judgment: Judgment normal.     Diagnostics: FVC 3.25, FEV1 2.58.  Predicted FVC 3.30, predicted FEV1 2.55.  Spirometry indicates normal ventilatory function.  Assessment and Plan: 1. Severe persistent asthma with acute exacerbation   2. Seasonal and perennial allergic rhinitis   3. Chronic cough   4. Hoarseness of voice   5. Gastroesophageal reflux disease without esophagitis     Patient Instructions  Severe persistent asthma Continue Symbicort 160-2 puffs twice a day with spacer to help prevent cough and  wheeze Continue Spiriva Respimat 1.25-use 2 puffs once a day to help prevent cough and wheeze. Do not use a spacer with this inhaler Continue montelukast 10 mg once a day to help prevent cough and wheeze Continue Dupixent injections every other week May use Ventolin 2 puffs every 4 hours as needed for cough, wheeze, tightness in chest, or shortness of breath For asthma flare, begin Flovent 110-2 puffs twice a day with a spacer for 2 weeks or until cough and wheeze free Continue to follow up with Dr. Su Monks as scheduled  Seasonal and perennial allergic rhinitis Continue Allegra 180 mg once a day as needed for runny nose Continue Nasacort 1 spray each nostril twice a day as needed for stuffy nose Continue ipratropium nasal spray 0.03% 2 sprays each nostril up to 3 times a day as needed for runny nose or drainage. Consider saline nasal rinses as needed for nasal symptoms. Use this before any medicated nasal sprays for best result  Reflux Continue omeprazole 40 mg 1 capsule twice a day Continue dietary and lifestyle modifications as listed below  Call the clinic if this treatment plan is not working well for you  Follow up in 4 months or sooner if needed.   Return in about 4 months (around 09/11/2020), or if symptoms worsen or fail to improve.    Thank you for the opportunity to care for this patient.  Please do not hesitate to contact me with questions.  Thermon Leyland, FNP [facility]   ________________________________________________  I have provided oversight concerning [name]'s evaluation and treatment of this patient's health issues addressed during today's encounter.  I agree with the assessment and therapeutic plan as outlined in the note.   Signed,   R Jorene Guest, MD

## 2020-05-20 ENCOUNTER — Other Ambulatory Visit: Payer: Self-pay | Admitting: Family Medicine

## 2020-05-20 MED ORDER — BUDESONIDE-FORMOTEROL FUMARATE 160-4.5 MCG/ACT IN AERO: 2.0000 | INHALATION_SPRAY | Freq: Two times a day (BID) | RESPIRATORY_TRACT | 1 refills | Status: DC

## 2020-05-20 MED ORDER — ALBUTEROL SULFATE HFA 108 (90 BASE) MCG/ACT IN AERS: 2.0000 | INHALATION_SPRAY | RESPIRATORY_TRACT | 0 refills | Status: DC | PRN

## 2020-05-20 MED ORDER — OMEPRAZOLE 40 MG PO CPDR: DELAYED_RELEASE_CAPSULE | ORAL | 1 refills | Status: DC

## 2020-05-20 MED ORDER — MONTELUKAST SODIUM 10 MG PO TABS: ORAL_TABLET | ORAL | 1 refills | Status: DC

## 2020-05-20 NOTE — Telephone Encounter (Signed)
Pt request refills for spiriva symbicort albuterol (proventil) singulair omeprazone

## 2020-05-20 NOTE — Telephone Encounter (Signed)
Sent a 90 day supply of those medications to [facility].   Pt last seen 05/11/20 by Thermon Leyland, FNP and she is due to return for office visit in February 2022.

## 2020-05-23 DIAGNOSIS — K449 Diaphragmatic hernia without obstruction or gangrene: Secondary | ICD-10-CM

## 2020-05-23 HISTORY — DX: Diaphragmatic hernia without obstruction or gangrene: K44.9

## 2020-06-01 ENCOUNTER — Other Ambulatory Visit: Payer: Self-pay | Admitting: Family Medicine

## 2020-07-20 ENCOUNTER — Telehealth: Payer: Self-pay | Admitting: Family Medicine

## 2020-07-20 NOTE — Telephone Encounter (Signed)
I have not seen this patient in 2 years.  Bridget Bell and Bridget Bell have both seen her multiple times, since Bridget Bell has an opening in her schedule tomorrow morning, please have her Bridget Bell tomorrow morning.   Thanks.

## 2020-07-20 NOTE — Telephone Encounter (Signed)
Pt calling with congestion in lungs, causing her to cough more. Using nebulizer, but is requesting prednisone. Had stuffy nose for a few weeks but it has not went into her lungs. Does not seem to be infection.   Bridget Bell [phone]

## 2020-07-20 NOTE — Telephone Encounter (Signed)
Dr. Nunzio Cobbs, the patient is requesting prednisone due to her cough and wants it clear her cough, she has fluid in her lungs. Please advise.

## 2020-07-21 ENCOUNTER — Ambulatory Visit: Payer: BC Managed Care – PPO | Admitting: Family

## 2020-07-21 ENCOUNTER — Other Ambulatory Visit: Payer: Self-pay

## 2020-07-21 ENCOUNTER — Encounter: Payer: Self-pay | Admitting: Family

## 2020-07-21 ENCOUNTER — Ambulatory Visit (HOSPITAL_BASED_OUTPATIENT_CLINIC_OR_DEPARTMENT_OTHER)
Admission: RE | Admit: 2020-07-21 | Discharge: 2020-07-21 | Disposition: A | Discharge status: Home / Self Care | Payer: BC Managed Care – PPO | Source: Ambulatory Visit | Attending: Family | Admitting: Family

## 2020-07-21 VITALS — BP 140/94 | HR 108 | Temp 99.5°F | Resp 16

## 2020-07-21 DIAGNOSIS — J3089 Other allergic rhinitis: Secondary | ICD-10-CM

## 2020-07-21 DIAGNOSIS — J4551 Severe persistent asthma with (acute) exacerbation: Secondary | ICD-10-CM | POA: Diagnosis not present

## 2020-07-21 DIAGNOSIS — R490 Dysphonia: Secondary | ICD-10-CM

## 2020-07-21 DIAGNOSIS — R053 Chronic cough: Secondary | ICD-10-CM

## 2020-07-21 DIAGNOSIS — R059 Cough, unspecified: Secondary | ICD-10-CM

## 2020-07-21 DIAGNOSIS — K219 Gastro-esophageal reflux disease without esophagitis: Secondary | ICD-10-CM

## 2020-07-21 DIAGNOSIS — J302 Other seasonal allergic rhinitis: Secondary | ICD-10-CM

## 2020-07-21 NOTE — Patient Instructions (Addendum)
Severe persistent asthma Get STAT PA/lateral chest x-ray today after you leave the office. Start prednisone 10 mg take 1 tablet twice a day for 4 days, then take 1 tablet once a day on the fifth day then stop.  Do not start this until after we call you with chest x-ray results Continue Symbicort 160-2 puffs twice a day with spacer to help prevent cough and wheeze Continue Spiriva Respimat 1.25-use 2 puffs once a day to help prevent cough and wheeze. Do not use a spacer with this inhaler Continue montelukast 10 mg once a day to help prevent cough and wheeze Continue Dupixent injections every other week May use Ventolin 2 puffs every 4 hours as needed for cough, wheeze, tightness in chest, or shortness of breath For asthma flare, begin Flovent 110-2 puffs twice a day with a spacer for 2 weeks or until cough and wheeze free Continue to follow up with Dr. Su Monks as scheduled  Seasonal and perennial allergic rhinitis Continue Allegra 180 mg once a day as needed for runny nose Stop Nasacort Start Nasonex 1 spray each nostril twice a day as needed for stuffy nose Continue ipratropium nasal spray 0.03% 2 sprays each nostril up to 3 times a day as needed for runny nose or drainage. Start saline nasal rinses one to two times a day for drainage. Use this before any medicated nasal sprays   Reflux Continue omeprazole 40 mg 1 capsule twice a day Continue dietary and lifestyle modifications as listed below Continue to follow up with GI  Persistent cough Continue to follow up with ENT, GI, Pulmonary, and speech therapy  Call the clinic if this treatment plan is not working well for you  Scheudule a follow up appointment in 2 weeks

## 2020-07-21 NOTE — Progress Notes (Addendum)
[address] [place] [address] Dept: [phone]  FOLLOW UP NOTE  Patient ID: Bridget Bell, female    DOB: 1960-12-05  Age: 59 y.o. MRN: [id number] Date of Office Visit: 07/21/2020  Assessment  Chief Complaint: Allergies (X 3 weeks and coughing up green and yellow mucus, sinus pressure)  HPI Bridget Bell is a 59 year old female who presents today for an acute visitof productive cough with yellow/green sputum and feeling like she has fluid in her lungs since Christmas.  She was last seen on May 11, 2020 by Thermon Leyland, FNP for severe persistent asthma with acute exacerbation, seasonal and perennial allergic rhinitis, chronic cough, and hoarseness of voice.  Severe persistent asthma is reported as not well controlled with Symbicort 160/4.5 mcg 2 puffs twice a day with spacer, Spiriva Respimat 1.25 mcg 2 puffs once a day, montelukast 10 mg once a day, Dupixent injections every other week, and albuterol as needed.  She reports a productive cough that was yellow/green this morning in color, wheezing, tightness in her chest, and shortness of breath.  She also feels like she has had fluid in her lungs since Christmas.  She denies any nocturnal awakenings.  Since her last office visit she has not required any systemic steroids or made any trips to the emergency room or urgent care due to breathing problems.  For the past couple days she has used her albuterol inhaler 2 puffs in the morning and 2 puffs at night.  Yesterday she also used her nebulizer twice.  Once with albuterol and the second time with albuterol/ ipratropium bromide.  She continues to follow with Dr. Su Monks, pulmonary, and did not ever stop Symbicort or Spiriva Respimat as recommended because she did not know she was supposed to and that Symbicort and Spiriva" are 2 important medicines."   Seasonal and perennial allergic rhinitis is reported as not well controlled with Allegra 180 mg once a day, Nasacort nasal spray, and saline  nasal rinses as needed.  She is not currently using ipratropium nasal spray due to it drying her nose out.  She also reports that she liked Nasonex better than Nasacort.  She feels that Nasonex help open her nasal passages up more. She has not been using saline rinses lately.  She reports a small amount of clear rhinorrhea, but this morning she saw" 2 green pieces in with her clear rhinorrhea."  Also, she reports nasal congestion, postnasal drip, and a little bit of sinus tenderness behind her eyes.  Reflux is reported as controlled with omeprazole 40 mg twice a day.  She reports that she had a little bit of heartburn yesterday, but attributes this to having a colonoscopy yesterday and not having any food on her stomach when she ate.  She took her omeprazole and the heartburn went away.   She reports that on November 30 she had an esophagram that showed a small hiatal hernia and that her food digests slowly.  She feels that the hiatal hernia may be the cause of her cough.  Her last office visit she also saw ENT, Dr. Harriette Ohara and had a flexible laryngoscopy showing:" multifactorial dysphonia, MTD, left VF lesion."   Drug Allergies:  Allergies  Allergen Reactions  . Amoxicillin-Pot Clavulanate Other (See Comments)    Real bad yeast infection    Review of Systems: Review of Systems  Constitutional: Negative for chills and fever.  HENT:       Reports postnasal drip, rhinorrhea, postnasal drip, and a little  bit of sinus tenderness behind the eyes.  Eyes:       Denies itchy eyes, reports watery eyes  Respiratory: Positive for cough, shortness of breath and wheezing.   Cardiovascular: Negative for chest pain and palpitations.  Gastrointestinal: Positive for heartburn. Negative for abdominal pain.       A little bit of heartburn yesterday and omeprazole helped  Genitourinary: Negative for dysuria.  Skin: Negative for itching and rash.  Neurological: Positive for headaches.       Reports  headaches only when coughing.  Endo/Heme/Allergies: Positive for environmental allergies.   Physical Exam: BP (!) 140/94   Pulse (!) 108   Temp 99.5 F (37.5 C) (Temporal)   Resp 16   SpO2 99%    Physical Exam Constitutional:      Appearance: Normal appearance.  HENT:     Head: Normocephalic and atraumatic.     Comments: Pharynx slightly erythematous, eyes normal, ears normal, nose, moderately edematous with no drainage noted    Right Ear: Tympanic membrane, ear canal and external ear normal.     Left Ear: Tympanic membrane, ear canal and external ear normal.     Mouth/Throat:     Mouth: Mucous membranes are moist.     Pharynx: Oropharynx is clear.  Eyes:     Conjunctiva/sclera: Conjunctivae normal.  Cardiovascular:     Rate and Rhythm: Regular rhythm.  Pulmonary:     Effort: Pulmonary effort is normal.     Breath sounds: Normal breath sounds.     Comments: Lungs clear to auscultation Musculoskeletal:     Cervical back: Neck supple.  Skin:    General: Skin is warm.  Neurological:     Mental Status: She is alert and oriented to person, place, and time.  Psychiatric:        Mood and Affect: Mood normal.        Behavior: Behavior normal.        Thought Content: Thought content normal.        Judgment: Judgment normal.    Diagnostics: FVC 2.68 L, FEV1 2.51 L.  Predicted FVC 3.16 L, FEV1 2.43 L.  Parameter indicates normal ventilatory function.  Assessment and Plan: 1. Severe persistent asthma with acute exacerbation   2. Seasonal and perennial allergic rhinitis   3. Chronic cough   4. Gastroesophageal reflux disease without esophagitis   5. Hoarseness of voice   6. Cough     Patient Instructions  Severe persistent asthma Get STAT PA/lateral chest x-ray today after you leave the office. Start prednisone 10 mg take 1 tablet twice a day for 4 days, then take 1 tablet once a day on the fifth day then stop.  Do not start this until after we call you with chest x-ray  results Continue Symbicort 160-2 puffs twice a day with spacer to help prevent cough and wheeze Continue Spiriva Respimat 1.25-use 2 puffs once a day to help prevent cough and wheeze. Do not use a spacer with this inhaler Continue montelukast 10 mg once a day to help prevent cough and wheeze Continue Dupixent injections every other week May use Ventolin 2 puffs every 4 hours as needed for cough, wheeze, tightness in chest, or shortness of breath For asthma flare, begin Flovent 110-2 puffs twice a day with a spacer for 2 weeks or until cough and wheeze free Continue to follow up with Dr. Su Monks as scheduled  Seasonal and perennial allergic rhinitis Continue Allegra 180 mg once a day as  needed for runny nose Stop Nasacort Start Nasonex 1 spray each nostril twice a day as needed for stuffy nose Continue ipratropium nasal spray 0.03% 2 sprays each nostril up to 3 times a day as needed for runny nose or drainage. Start saline nasal rinses one to two times a day for drainage. Use this before any medicated nasal sprays   Reflux Continue omeprazole 40 mg 1 capsule twice a day Continue dietary and lifestyle modifications as listed below Continue to follow up with GI  Persistent cough Continue to follow up with ENT, GI, Pulmonary, and speech therapy  Call the clinic if this treatment plan is not working well for you  Scheudule a follow up appointment in 2 weeks      Return in about 2 weeks (around 08/04/2020), or if symptoms worsen or fail to improve.    Thank you for the opportunity to care for this patient.  Please do not hesitate to contact me with questions.  Nehemiah Settle, FNP [facility]  ________________________________________________  I have provided oversight concerning [facility]'s evaluation and treatment of this patient's health issues addressed during today's encounter.  I agree with the assessment and therapeutic plan as outlined in the  note.   Signed,   R Jorene Guest, MD

## 2020-07-21 NOTE — Progress Notes (Signed)
Please let the patient know that her chest x-ray is clear and to go ahead and start the prednisone that was given at today's visit.  Thank you, Nehemiah Settle

## 2020-08-09 ENCOUNTER — Other Ambulatory Visit: Payer: Self-pay

## 2020-08-09 MED ORDER — TRIAMCINOLONE ACETONIDE 55 MCG/ACT NA AERO: INHALATION_SPRAY | NASAL | 3 refills | Status: DC

## 2020-11-23 ENCOUNTER — Other Ambulatory Visit: Payer: Self-pay | Admitting: Family Medicine

## 2020-11-28 ENCOUNTER — Ambulatory Visit: Payer: BC Managed Care – PPO | Admitting: Family Medicine

## 2020-11-28 ENCOUNTER — Ambulatory Visit (HOSPITAL_BASED_OUTPATIENT_CLINIC_OR_DEPARTMENT_OTHER)
Admission: RE | Admit: 2020-11-28 | Discharge: 2020-11-28 | Disposition: A | Discharge status: Home / Self Care | Payer: BC Managed Care – PPO | Source: Ambulatory Visit | Attending: Family Medicine | Admitting: Family Medicine

## 2020-11-28 ENCOUNTER — Other Ambulatory Visit: Payer: Self-pay | Admitting: *Deleted

## 2020-11-28 ENCOUNTER — Other Ambulatory Visit: Payer: Self-pay

## 2020-11-28 ENCOUNTER — Encounter: Payer: Self-pay | Admitting: Family Medicine

## 2020-11-28 VITALS — BP 144/84 | HR 98 | Temp 97.9°F | Resp 16

## 2020-11-28 DIAGNOSIS — K219 Gastro-esophageal reflux disease without esophagitis: Secondary | ICD-10-CM

## 2020-11-28 DIAGNOSIS — J3089 Other allergic rhinitis: Secondary | ICD-10-CM | POA: Diagnosis not present

## 2020-11-28 DIAGNOSIS — J4541 Moderate persistent asthma with (acute) exacerbation: Secondary | ICD-10-CM | POA: Diagnosis not present

## 2020-11-28 DIAGNOSIS — J302 Other seasonal allergic rhinitis: Secondary | ICD-10-CM

## 2020-11-28 DIAGNOSIS — R053 Chronic cough: Secondary | ICD-10-CM

## 2020-11-28 DIAGNOSIS — R490 Dysphonia: Secondary | ICD-10-CM | POA: Diagnosis not present

## 2020-11-28 MED ORDER — PREDNISONE 10 MG PO TABS: ORAL_TABLET | ORAL | 0 refills | Status: DC

## 2020-11-28 MED ORDER — DOXYCYCLINE HYCLATE 100 MG PO TABS: 100.0000 mg | ORAL_TABLET | Freq: Two times a day (BID) | ORAL | 0 refills | Status: AC

## 2020-11-28 NOTE — Progress Notes (Signed)
[address] [place] [address] Dept: [phone]  FOLLOW UP NOTE  Patient ID: Bridget Bell, female    DOB: Dec 01, 1960  Age: 60 y.o. MRN: [id number] Date of Office Visit: 11/28/2020  Assessment  Chief Complaint: Follow-up (URI for over a week now.)  HPI Bridget Bell is a 60 year old female who presents to the clinic for evaluation of cough producing thick green sputum.  She was last seen in this clinic on 07/21/2020 by Nehemiah Settle, FNP, for evaluation of asthma allergic rhinitis, chronic cough, reflux, and hoarseness.  At today's visit, she reports that between 1 and 2 weeks ago she developed cough producing thick green mucus.  She reports her asthma has been poorly controlled with shortness of breath occurring with coughing spells, wheezing at night for the last week and occasionally in the daytime, and cough occurring more frequently with occasional posttussive vomiting and green mucus for between 1 and 2 weeks.  She reports chest tightness over the last week.  She continues montelukast 10 mg once a day, Symbicort 160-2 puffs twice a day, Spiriva 1.25 mcg - 2 puffs once a day and is not currently using albuterol.  She does report that she added Flovent 110 2 puffs twice a day about 5 days ago.  She continues Dupixent injections once every 2 weeks.  Allergic rhinitis is reported as poorly controlled with green nasal drainage, nasal congestion, sneezing, and thick postnasal drainage.  She continues Allegra 180 mg once a day, Nasonex 2 sprays in each nostril once a day and saline nasal rinses.  She also continues Mucinex once a day.  Reflux is reported as well controlled with omeprazole 40 mg twice a day.  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  Her current medications are listed in the chart.   Drug Allergies:  Allergies  Allergen Reactions  . Amoxicillin-Pot Clavulanate Other (See Comments)    Real bad  yeast infection    Physical Exam: BP (!) 144/84 (BP Location: Left Arm, Patient Position: Sitting)   Pulse 98   Temp 97.9 F (36.6 C) (Temporal)   Resp 16   SpO2 100%    Physical Exam Vitals reviewed.  Constitutional:      Appearance: Normal appearance.  HENT:     Head: Normocephalic and atraumatic.     Right Ear: Tympanic membrane normal.     Left Ear: Tympanic membrane normal.     Nose:     Comments: Bilateral nares erythematous with clear nasal drainage.  Pharynx erythematous with no exudate.  Ears normal.  Eyes normal. Eyes:     Conjunctiva/sclera: Conjunctivae normal.  Cardiovascular:     Rate and Rhythm: Normal rate and regular rhythm.     Heart sounds: Normal heart sounds. No murmur heard.   Pulmonary:     Effort: Pulmonary effort is normal.     Breath sounds: Normal breath sounds.     Comments: Lungs clear to auscultation Musculoskeletal:        General: Normal range of motion.     Cervical back: Normal range of motion and neck supple.  Skin:    General: Skin is warm and dry.  Neurological:     Mental Status: She is alert and oriented to person, place, and time.  Psychiatric:        Mood and Affect: Mood normal.        Behavior: Behavior normal.  Thought Content: Thought content normal.        Judgment: Judgment normal.     Diagnostics: FVC 2.23, FEV1 1.82.  Predicted FVC 2.77, predicted FEV1 2.34.  Spirometry indicates normal ventilatory function.  Assessment and Plan: 1. Moderate persistent asthma with acute exacerbation   2. Chronic cough   3. Seasonal and perennial allergic rhinitis   4. Hoarseness of voice   5. Gastroesophageal reflux disease without esophagitis     Patient Instructions  Cough Get STAT PA/lateral chest x-ray today after you leave the office. We will call you when the result becomes available  Asthma Continue Symbicort 160-2 puffs twice a day with spacer to help prevent cough and wheeze Continue Spiriva Respimat  1.25-use 2 puffs once a day to help prevent cough and wheeze. Do not use a spacer with this inhaler Continue montelukast 10 mg once a day to help prevent cough and wheeze Continue Dupixent injections every other week May use Ventolin 2 puffs every 4 hours as needed for cough, wheeze, tightness in chest, or shortness of breath For asthma flare, begin Flovent 110-2 puffs twice a day with a spacer for 2 weeks or until cough and wheeze free  Seasonal and perennial allergic rhinitis Stop Allegra for 1 week, then restart Allegra 180 mg once a day as needed for runny nose Continue Nasonex 1 spray each nostril twice a day as needed for stuffy nose Continue Mucinex [phone] mg twice a day and increase fluid intake to thin out mucus Continue ipratropium nasal spray 0.03% 2 sprays each nostril up to 3 times a day as needed for runny nose or drainage. Start saline nasal rinses one to two times a day for drainage. Use this before any medicated nasal sprays   Reflux Continue omeprazole 40 mg 1 capsule twice a day Continue dietary and lifestyle modifications as listed below Continue to follow up with GI  Persistent cough Continue to follow up with your gastrointestinal specialist.   Call the clinic if this treatment plan is not working well for you  [place] a follow up appointment in 2 weeks   Return in about 2 weeks (around 12/12/2020), or if symptoms worsen or fail to improve.    Thank you for the opportunity to care for this patient.  Please do not hesitate to contact me with questions.  Thermon Leyland, FNP [facility]

## 2020-11-28 NOTE — Telephone Encounter (Signed)
Refer to lab note. Reviewed results. Sent prednisone and doxycycline. Pt informed.

## 2020-11-28 NOTE — Patient Instructions (Addendum)
Cough Get STAT PA/lateral chest x-ray today after you leave the office. We will call you when the result becomes available  Asthma Continue Symbicort 160-2 puffs twice a day with spacer to help prevent cough and wheeze Continue Spiriva Respimat 1.25-use 2 puffs once a day to help prevent cough and wheeze. Do not use a spacer with this inhaler Continue montelukast 10 mg once a day to help prevent cough and wheeze Continue Dupixent injections every other week May use Ventolin 2 puffs every 4 hours as needed for cough, wheeze, tightness in chest, or shortness of breath For asthma flare, begin Flovent 110-2 puffs twice a day with a spacer for 2 weeks or until cough and wheeze free  Seasonal and perennial allergic rhinitis Stop Allegra for 1 week, then restart Allegra 180 mg once a day as needed for runny nose Continue Nasonex 1 spray each nostril twice a day as needed for stuffy nose Continue Mucinex [phone] mg twice a day and increase fluid intake to thin out mucus Continue ipratropium nasal spray 0.03% 2 sprays each nostril up to 3 times a day as needed for runny nose or drainage. Start saline nasal rinses one to two times a day for drainage. Use this before any medicated nasal sprays   Reflux Continue omeprazole 40 mg 1 capsule twice a day Continue dietary and lifestyle modifications as listed below Continue to follow up with GI  Persistent cough Continue to follow up with ENT, GI, Pulmonary, and speech therapy  Call the clinic if this treatment plan is not working well for you  Scheudule a follow up appointment in 2 weeks

## 2020-11-28 NOTE — Progress Notes (Signed)
Can you please call this patient and let her know her chest xray was negative for cardiopulmonary disease. Please call in Doxycycline 100 mg twice a day for 10 days for acute sinusitis and also prednisone 10 mg tablets. Take 2 tablets twice a day for 3 days, then take 2 tablets once a day for 1 day, then take 1 tablet on the 5th day, then stop. Thank you

## 2020-12-21 ENCOUNTER — Other Ambulatory Visit: Payer: Self-pay

## 2020-12-21 ENCOUNTER — Encounter: Payer: Self-pay | Admitting: Family Medicine

## 2020-12-21 ENCOUNTER — Ambulatory Visit: Payer: BC Managed Care – PPO | Admitting: Family Medicine

## 2020-12-21 VITALS — BP 120/82 | HR 102 | Temp 97.9°F | Resp 20 | Wt 217.2 lb

## 2020-12-21 DIAGNOSIS — K219 Gastro-esophageal reflux disease without esophagitis: Secondary | ICD-10-CM | POA: Diagnosis not present

## 2020-12-21 DIAGNOSIS — J4540 Moderate persistent asthma, uncomplicated: Secondary | ICD-10-CM

## 2020-12-21 DIAGNOSIS — R490 Dysphonia: Secondary | ICD-10-CM

## 2020-12-21 DIAGNOSIS — J302 Other seasonal allergic rhinitis: Secondary | ICD-10-CM

## 2020-12-21 DIAGNOSIS — R053 Chronic cough: Secondary | ICD-10-CM | POA: Diagnosis not present

## 2020-12-21 DIAGNOSIS — J3089 Other allergic rhinitis: Secondary | ICD-10-CM

## 2020-12-21 MED ORDER — AZITHROMYCIN 250 MG PO TABS: 250.0000 mg | ORAL_TABLET | Freq: Every day | ORAL | 0 refills | Status: DC

## 2020-12-21 NOTE — Patient Instructions (Addendum)
Asthma Continue Symbicort 160-2 puffs twice a day with spacer to help prevent cough and wheeze Continue Spiriva Respimat 1.25-use 2 puffs once a day to help prevent cough and wheeze. Do not use a spacer with this inhaler Continue montelukast 10 mg once a day to help prevent cough and wheeze Continue Dupixent injections once every 14 days and have access to an epinephrine auto-injector set May use Ventolin 2 puffs every 4 hours as needed for cough, wheeze, tightness in chest, or shortness of breath For asthma flare, begin Flovent 110-2 puffs twice a day with a spacer for 2 weeks or until cough and wheeze free  Seasonal and perennial allergic rhinitis Continue Allegra 180 mg once a day as needed for runny nose Begin Nasacort 1 spray each nostril twice a day as needed for stuffy nose Continue Mucinex [phone] mg twice a day and increase fluid intake to thin out mucus Continue ipratropium nasal spray 0.03% 2 sprays each nostril up to 3 times a day as needed for runny nose or drainage. Continue saline nasal rinses one to two times a day for drainage. Use this before any medicated nasal sprays   Reflux Continue omeprazole 40 mg 1 capsule twice a day as previously prescribed Continue dietary and lifestyle modifications as listed below Continue to follow up with GI  Persistent cough Begin azithromycin to decrease inflammation and cough. Take 500 mg on the first day, then take 250 mg once a day for the next 4 days, then stop Consider wearing a face mask for your own protection Continue to follow up your gastroenterologist  Call the clinic if this treatment plan is not working well for you  Follow up in 2 months or sooner if needed.

## 2020-12-21 NOTE — Progress Notes (Signed)
[address] [place] [address] Dept: [phone]  FOLLOW UP NOTE  Patient ID: Bridget Bell, female    DOB: 1961-01-31  Age: 60 y.o. MRN: [id number] Date of Office Visit: 12/21/2020  Assessment  Chief Complaint: Follow-up and Sinus Problem (URI onset mid last week.)  HPI Bridget Bell is a 60 year old female who presents to the clinic for follow-up visit.  She was last seen in this clinic on 11/28/2020 for evaluation of cough, asthma, allergic rhinitis, and reflux.  At today's visit, she reports her asthma has been moderately well controlled with shortness of breath occurring with activity, wheeze occurring at night, and cough which is occasionally dry and occasionally produces clear thin mucus.  She reports the symptoms have improved since her last visit to this clinic, however, have not completely resolved.  She continues montelukast 10 mg once a day, Spiriva 1.25 mcg 2 puffs once a day, Symbicort 160-2 puffs twice a day, albuterol once a day, and Dupixent injections once every other week.  She reports that she has restarted Flovent for asthma flare a couple of days ago with moderate resolution of asthma symptoms.  Allergic rhinitis is reported as poorly controlled with symptoms including green drainage, nasal congestion, sneeze, and thick postnasal drainage.  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  She continues Allegra 180 mg once a day and Mucinex twice a day.  She is not currently using a nasal steroid spray or nasal antihistamine spray.  She has recently started using saline nasal spray.  Reflux is reported as moderately well controlled with omeprazole daily.  She has recently visited a gastroenterology specialist where she has completed the first in a series of 4 tests.  Atopic dermatitis is reported as moderately well controlled with red itchy areas occurring in areas where her clothing produces pressure on her skin.  She is currently using  an eczema cream and eczema body wash.  She is not interested in a medicated cream for this issue.  Her current medications are listed in the chart.   Drug Allergies:  Allergies  Allergen Reactions  . Amoxicillin-Pot Clavulanate Other (See Comments)    Real bad yeast infection    Physical Exam: BP 120/82 (BP Location: Left Arm, Patient Position: Sitting)   Pulse (!) 102   Temp 97.9 F (36.6 C) (Temporal)   Resp 20   Wt 217 lb 3.2 oz (98.5 kg)   SpO2 99%   BMI 38.48 kg/m    Physical Exam Vitals reviewed.  Constitutional:      Appearance: Normal appearance.  HENT:     Head: Normocephalic and atraumatic.     Right Ear: Tympanic membrane normal.     Left Ear: Tympanic membrane normal.     Nose:     Comments: Bilateral nares slightly erythematous with clear nasal drainage noted.  Pharynx erythematous with no exudate ears normal.  Eyes normal.  Voice raspy Eyes:     Conjunctiva/sclera: Conjunctivae normal.  Cardiovascular:     Rate and Rhythm: Normal rate and regular rhythm.     Heart sounds: Normal heart sounds. No murmur heard.   Pulmonary:     Effort: Pulmonary effort is normal.     Breath sounds: Normal breath sounds.     Comments: Lungs clear to auscultation Musculoskeletal:        General: Normal range of motion.     Cervical back: Normal range of motion and neck supple.  Skin:  General: Skin is warm and dry.     Comments: No eczematous rash noted today  Neurological:     Mental Status: She is alert and oriented to person, place, and time.  Psychiatric:        Mood and Affect: Mood normal.        Behavior: Behavior normal.        Thought Content: Thought content normal.        Judgment: Judgment normal.     Diagnostics: FVC 2.75, FEV1 2.36.  Predicted FVC 2.98, predicted FEV1 2.35.  Spirometry indicates normal ventilatory function.   Assessment and Plan: 1. Moderate persistent asthma without complication   2. Chronic cough   3. Hoarseness of voice    4. Gastroesophageal reflux disease without esophagitis   5. Seasonal and perennial allergic rhinitis     Meds ordered this encounter  Medications  . azithromycin (ZITHROMAX) 250 MG tablet    Sig: Take 1 tablet (250 mg total) by mouth daily. Take 2 tablets on the first day, then take 1 tablet once a day for the next 4 days, then stop    Dispense:  6 tablet    Refill:  0    Patient Instructions  Asthma Continue Symbicort 160-2 puffs twice a day with spacer to help prevent cough and wheeze Continue Spiriva Respimat 1.25-use 2 puffs once a day to help prevent cough and wheeze. Do not use a spacer with this inhaler Continue montelukast 10 mg once a day to help prevent cough and wheeze Continue Dupixent injections once every 14 days and have access to an epinephrine auto-injector set May use Ventolin 2 puffs every 4 hours as needed for cough, wheeze, tightness in chest, or shortness of breath For asthma flare, begin Flovent 110-2 puffs twice a day with a spacer for 2 weeks or until cough and wheeze free  Seasonal and perennial allergic rhinitis Continue Allegra 180 mg once a day as needed for runny nose Begin Nasacort 1 spray each nostril twice a day as needed for stuffy nose Continue Mucinex [phone] mg twice a day and increase fluid intake to thin out mucus Continue ipratropium nasal spray 0.03% 2 sprays each nostril up to 3 times a day as needed for runny nose or drainage. Continue saline nasal rinses one to two times a day for drainage. Use this before any medicated nasal sprays   Reflux Continue omeprazole 40 mg 1 capsule twice a day as previously prescribed Continue dietary and lifestyle modifications as listed below Continue to follow up with GI  Persistent cough Begin azithromycin to decrease inflammation and cough. Take 500 mg on the first day, then take 250 mg once a day for the next 4 days, then stop Consider wearing a face mask for your own protection Continue to follow up  your gastroenterologist  Call the clinic if this treatment plan is not working well for you  Follow up in 2 months or sooner if needed.   Return in about 2 months (around 02/20/2021), or if symptoms worsen or fail to improve.    Thank you for the opportunity to care for this patient.  Please do not hesitate to contact me with questions.  Thermon Leyland, FNP [facility]

## 2020-12-22 ENCOUNTER — Other Ambulatory Visit: Payer: Self-pay | Admitting: Family Medicine

## 2021-01-08 ENCOUNTER — Other Ambulatory Visit: Payer: Self-pay | Admitting: Family Medicine

## 2021-01-26 DIAGNOSIS — J301 Allergic rhinitis due to pollen: Secondary | ICD-10-CM | POA: Diagnosis not present

## 2021-01-26 NOTE — Progress Notes (Signed)
VIALS MADE. EXP 01-26-22 

## 2021-02-03 ENCOUNTER — Other Ambulatory Visit: Payer: Self-pay

## 2021-02-06 ENCOUNTER — Encounter: Payer: Self-pay | Admitting: *Deleted

## 2021-02-06 ENCOUNTER — Ambulatory Visit (INDEPENDENT_AMBULATORY_CARE_PROVIDER_SITE_OTHER): Payer: BC Managed Care – PPO | Admitting: *Deleted

## 2021-02-06 ENCOUNTER — Telehealth: Payer: Self-pay | Admitting: *Deleted

## 2021-02-06 ENCOUNTER — Other Ambulatory Visit: Payer: Self-pay

## 2021-02-06 DIAGNOSIS — J309 Allergic rhinitis, unspecified: Secondary | ICD-10-CM | POA: Diagnosis not present

## 2021-02-06 NOTE — Progress Notes (Signed)
Immunotherapy   Patient Details  Name: Bridget Bell MRN: [id number] Date of Birth: Jun 15, 1961  02/06/2021  Bridget Bell started injections for  grass-tree blue vial expires 01/26/22 Following schedule: A  Frequency:1 time per week Epi-Pen:Epi-Pen Available  Consent signed and patient instructions given.    Bridget Bell 02/06/2021, 12:22 PM

## 2021-02-06 NOTE — Telephone Encounter (Signed)
Bridget Bell came in for a restart on allergy injections. Blue vial of Donzetta Sprung was given today. 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 [place] [address] tele: [phone] fax: [phone]. The school nurse, Leanna Sato would be administering.   Her last allergen immunotherapy script is from 09/10/2016 and her last allergy test was 01/12/19. She has some additional allergens that would need to be added to her vials including dust mite, dog and cat.

## 2021-02-09 ENCOUNTER — Encounter: Payer: Self-pay | Admitting: Family Medicine

## 2021-02-09 NOTE — Addendum Note (Signed)
Addended by: Alfonse Spruce on: 02/09/2021 10:33 AM   Modules accepted: Orders

## 2021-02-09 NOTE — Telephone Encounter (Signed)
Called patient to notify her that she will be on two injections now to include her new allergies of dust mite, cat and dog.  Patient states she had to go because she was involved in procedure. Will need further explanation when she calls back. All the allergens cannot be combined into one vial now and will have to be two injections.

## 2021-02-10 DIAGNOSIS — J3089 Other allergic rhinitis: Secondary | ICD-10-CM | POA: Diagnosis not present

## 2021-02-10 NOTE — Progress Notes (Signed)
VIALS MADE. EXP 02-10-22

## 2021-02-10 NOTE — Progress Notes (Signed)
Aeroallergen Immunotherapy   Ordering Provider: Dr. Malachi Bonds   Patient Details  Name: Bridget Bell  MRN: [id number]  Date of Birth: 25-Dec-1960   Order 2 of 2   Vial Label: DM/D/C   0.5 ml (Volume)  1:10 Concentration -- Cat Hair  0.5 ml (Volume)  1:10 Concentration -- Dog Epithelia  0.5 ml (Volume)   AU Concentration -- Mite Mix (DF 5,000 & DP 5,000)    1.5  ml Extract Subtotal  3.5  ml Diluent  5.0  ml Maintenance Total   Schedule:  A   Blue Vial (1:100,000): Schedule A (10 doses)  Yellow Vial (1:10,000): Schedule A (10 doses)  Green Vial (1:1,000): Schedule A (10 doses)  Red Vial (1:100): Schedule A (10 doses)   Special Instructions: none

## 2021-02-13 ENCOUNTER — Ambulatory Visit (INDEPENDENT_AMBULATORY_CARE_PROVIDER_SITE_OTHER): Payer: BC Managed Care – PPO

## 2021-02-13 DIAGNOSIS — J309 Allergic rhinitis, unspecified: Secondary | ICD-10-CM | POA: Diagnosis not present

## 2021-02-13 NOTE — Progress Notes (Signed)
Immunotherapy   Patient Details  Name: Bridget Bell MRN: [id number] Date of Birth: 1960/08/30  02/13/2021  Bridget Bell started injections for Blue 1:100,000(DM-D-C) Following schedule: A  Frequency:1 time per week Epi-Pen:Epi-Pen Available  Consent signed and patient instructions given.   Bridget Bell Bridget Bell 02/13/2021, 4:48 PM

## 2021-02-14 ENCOUNTER — Other Ambulatory Visit: Payer: Self-pay | Admitting: *Deleted

## 2021-02-14 MED ORDER — DUPIXENT 300 MG/2ML ~~LOC~~ SOSY: PREFILLED_SYRINGE | SUBCUTANEOUS | 11 refills | Status: DC

## 2021-02-19 ENCOUNTER — Other Ambulatory Visit: Payer: Self-pay | Admitting: Family Medicine

## 2021-02-20 ENCOUNTER — Ambulatory Visit: Payer: BC Managed Care – PPO | Admitting: Family Medicine

## 2021-02-21 ENCOUNTER — Ambulatory Visit (INDEPENDENT_AMBULATORY_CARE_PROVIDER_SITE_OTHER): Payer: BC Managed Care – PPO

## 2021-02-21 DIAGNOSIS — J309 Allergic rhinitis, unspecified: Secondary | ICD-10-CM | POA: Diagnosis not present

## 2021-03-08 ENCOUNTER — Ambulatory Visit (INDEPENDENT_AMBULATORY_CARE_PROVIDER_SITE_OTHER): Payer: BC Managed Care – PPO

## 2021-03-08 DIAGNOSIS — J309 Allergic rhinitis, unspecified: Secondary | ICD-10-CM | POA: Diagnosis not present

## 2021-03-21 ENCOUNTER — Ambulatory Visit (INDEPENDENT_AMBULATORY_CARE_PROVIDER_SITE_OTHER): Payer: BC Managed Care – PPO

## 2021-03-21 DIAGNOSIS — J309 Allergic rhinitis, unspecified: Secondary | ICD-10-CM | POA: Diagnosis not present

## 2021-03-28 ENCOUNTER — Ambulatory Visit (INDEPENDENT_AMBULATORY_CARE_PROVIDER_SITE_OTHER): Payer: BC Managed Care – PPO

## 2021-03-28 DIAGNOSIS — J309 Allergic rhinitis, unspecified: Secondary | ICD-10-CM | POA: Diagnosis not present

## 2021-04-04 ENCOUNTER — Ambulatory Visit (INDEPENDENT_AMBULATORY_CARE_PROVIDER_SITE_OTHER): Payer: BC Managed Care – PPO

## 2021-04-04 DIAGNOSIS — J309 Allergic rhinitis, unspecified: Secondary | ICD-10-CM | POA: Diagnosis not present

## 2021-04-13 ENCOUNTER — Ambulatory Visit (INDEPENDENT_AMBULATORY_CARE_PROVIDER_SITE_OTHER): Payer: BC Managed Care – PPO

## 2021-04-13 DIAGNOSIS — J309 Allergic rhinitis, unspecified: Secondary | ICD-10-CM

## 2021-04-17 ENCOUNTER — Ambulatory Visit (INDEPENDENT_AMBULATORY_CARE_PROVIDER_SITE_OTHER): Payer: BC Managed Care – PPO

## 2021-04-17 DIAGNOSIS — J309 Allergic rhinitis, unspecified: Secondary | ICD-10-CM | POA: Diagnosis not present

## 2021-04-25 ENCOUNTER — Ambulatory Visit (INDEPENDENT_AMBULATORY_CARE_PROVIDER_SITE_OTHER): Payer: BC Managed Care – PPO

## 2021-04-25 DIAGNOSIS — J309 Allergic rhinitis, unspecified: Secondary | ICD-10-CM

## 2021-05-02 ENCOUNTER — Ambulatory Visit (INDEPENDENT_AMBULATORY_CARE_PROVIDER_SITE_OTHER): Payer: BC Managed Care – PPO

## 2021-05-02 DIAGNOSIS — J309 Allergic rhinitis, unspecified: Secondary | ICD-10-CM | POA: Diagnosis not present

## 2021-05-09 ENCOUNTER — Ambulatory Visit (INDEPENDENT_AMBULATORY_CARE_PROVIDER_SITE_OTHER): Payer: BC Managed Care – PPO

## 2021-05-09 DIAGNOSIS — J309 Allergic rhinitis, unspecified: Secondary | ICD-10-CM

## 2021-05-17 ENCOUNTER — Ambulatory Visit (INDEPENDENT_AMBULATORY_CARE_PROVIDER_SITE_OTHER): Payer: BC Managed Care – PPO

## 2021-05-17 DIAGNOSIS — J309 Allergic rhinitis, unspecified: Secondary | ICD-10-CM

## 2021-05-26 ENCOUNTER — Telehealth: Payer: Self-pay

## 2021-05-26 NOTE — Telephone Encounter (Signed)
Patient is wheezing really badly, she states she has been taking all her medications but she's still wheezing really bad. She wants to know if she can have prednisone called in or does she need to be seen. Her last appointment was June 1,2022.  Bridget Bell [phone]

## 2021-05-26 NOTE — Telephone Encounter (Signed)
Can you please have her come in or do a televisit today? I'm happy to take her on my schedule. Thank you

## 2021-05-29 ENCOUNTER — Encounter: Payer: Self-pay | Admitting: Family Medicine

## 2021-05-29 ENCOUNTER — Other Ambulatory Visit: Payer: Self-pay

## 2021-05-29 ENCOUNTER — Ambulatory Visit: Payer: BC Managed Care – PPO | Admitting: Family Medicine

## 2021-05-29 VITALS — BP 132/84 | HR 110 | Temp 98.6°F | Resp 20 | Ht 63.0 in | Wt 211.2 lb

## 2021-05-29 DIAGNOSIS — B9789 Other viral agents as the cause of diseases classified elsewhere: Secondary | ICD-10-CM

## 2021-05-29 DIAGNOSIS — J302 Other seasonal allergic rhinitis: Secondary | ICD-10-CM

## 2021-05-29 DIAGNOSIS — J3089 Other allergic rhinitis: Secondary | ICD-10-CM | POA: Diagnosis not present

## 2021-05-29 DIAGNOSIS — K219 Gastro-esophageal reflux disease without esophagitis: Secondary | ICD-10-CM

## 2021-05-29 DIAGNOSIS — J4540 Moderate persistent asthma, uncomplicated: Secondary | ICD-10-CM

## 2021-05-29 DIAGNOSIS — K449 Diaphragmatic hernia without obstruction or gangrene: Secondary | ICD-10-CM | POA: Insufficient documentation

## 2021-05-29 DIAGNOSIS — J019 Acute sinusitis, unspecified: Secondary | ICD-10-CM

## 2021-05-29 DIAGNOSIS — R053 Chronic cough: Secondary | ICD-10-CM

## 2021-05-29 NOTE — Progress Notes (Addendum)
[address] [place] [address] Dept: [phone]  FOLLOW UP NOTE  Patient ID: Bridget Bell, female    DOB: [birth year]  Age: 60 y.o. MRN: [id number] Date of Office Visit: 05/29/2021  Assessment  Chief Complaint: Follow-up (Pt have been experiencing severe congestion, coughing, sinus pressure, greenish phlegm coming out, sinus headache.), Allergic Rhinitis , and Asthma  HPI Bridget Bell is a 60 year old female who presents to the clinic for cough and green nasal drainage.  She was last seen in this clinic on 12/21/2020 for evaluation of cough, asthma, allergic rhinitis, and reflux.  At today's visit, she reports her asthma has been moderately well controlled with symptoms including wheeze and cough producing mucus occurring in the daytime and the nighttime.  She continues montelukast 10 mg once a day, Symbicort 160-2 puffs twice a day, Spiriva 1.25 mcg 2 puffs once a day and is not currently using albuterol.  She continues Dupixent once every 2 weeks.  She reports a significant decrease in her symptoms of asthma while continuing Dupixent.  She does report that she began using Flovent 110-2 puffs twice a day several days ago.  Allergic rhinitis is reported as poorly controlled with symptoms including green thick rhinorrhea that began 1 week ago, nasal congestion, pressure under both eyes, occasional sneezing, and thick postnasal drainage.  She continues Allegra 180 mg once a day, saline rinses daily, Nasacort daily, and Mucinex twice a day.  She continues allergen immunotherapy directed toward pollens, pets, and dust mites with no large or local reactions.  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  She denies fever, body aches, sweats, chills, and sick contacts.  Reflux is reported as poorly controlled.  She reports that she has been diagnosed with hiatal hernia and follows a GI specialist.  Atopic dermatitis is reported as poorly controlled with red itchy areas occurring on her back  and her waist for which she uses a twice a day moisturizing routine.  She is not currently using a medicated ointment.  Her current medications are listed in the chart.   Drug Allergies:  Allergies  Allergen Reactions   Amoxicillin-Pot Clavulanate Other (See Comments)    Real bad yeast infection    Physical Exam: BP 132/84   Pulse (!) 110   Temp 98.6 F (37 C) (Temporal)   Resp 20   Ht 5\' 3"  (1.6 m)   Wt 211 lb 3.2 oz (95.8 kg)   SpO2 98%   BMI 37.41 kg/m    Physical Exam Vitals reviewed.  Constitutional:      Appearance: Normal appearance.  HENT:     Head: Normocephalic and atraumatic.     Right Ear: Tympanic membrane normal.     Left Ear: Tympanic membrane normal.     Nose:     Comments: Bilateral nares normal.  Pharynx slightly erythematous with no exudate.  Ears normal.  Eyes normal.    Mouth/Throat:     Pharynx: Oropharynx is clear.  Eyes:     Conjunctiva/sclera: Conjunctivae normal.  Cardiovascular:     Rate and Rhythm: Normal rate and regular rhythm.     Heart sounds: Normal heart sounds. No murmur heard. Pulmonary:     Effort: Pulmonary effort is normal.     Breath sounds: Normal breath sounds.     Comments: Lungs clear to auscultation Musculoskeletal:        General: Normal range of motion.     Cervical back: Normal range of motion and neck  supple.  Skin:    General: Skin is warm and dry.  Neurological:     Mental Status: She is alert and oriented to person, place, and time.  Psychiatric:        Mood and Affect: Mood normal.        Behavior: Behavior normal.        Thought Content: Thought content normal.        Judgment: Judgment normal.    Diagnostics: FVC 2.68, FEV1 2.26.  Predicted FVC 2.97, predicted FEV1 2.34.  Spirometry indicates normal ventilatory function.  Assessment and Plan: 1. Moderate persistent asthma without complication   2. Seasonal and perennial allergic rhinitis   3. Gastroesophageal reflux disease without esophagitis   4.  Hiatal hernia   5. Acute viral sinusitis      Patient Instructions  Asthma Continue Symbicort 160-2 puffs twice a day with spacer to help prevent cough and wheeze Continue Spiriva Respimat 1.25-use 2 puffs once a day to help prevent cough and wheeze. Do not use a spacer with this inhaler Continue montelukast 10 mg once a day to help prevent cough and wheeze Continue Dupixent injections once every 14 days and have access to an epinephrine auto-injector set May use Ventolin 2 puffs every 4 hours as needed for cough, wheeze, tightness in chest, or shortness of breath For asthma flare, begin Flovent 110-2 puffs twice a day with a spacer for 2 weeks or until cough and wheeze free  Suspected viral/ non-bacterial sinusitis Begin prednisone 10 mg tablets. Take 2 tablets twice a day for 3 days, then take 2 tablets once a day for 1 day, then take 1 tablet on the 5th day, then stop  Increase saline nasal rinses Continue a steroid nasal spray 2 sprays in each nostril daily Stop Allegra for the next 5 days Continue Mucinex [phone] mg twice a day and increase fluid intake to thin mucus Call the clinic if you develop a fever, your symptoms worsen, or do not resolve.   Seasonal and perennial allergic rhinitis Continue Allegra 180 mg once a day as needed for runny nose Continue Nasacort 1 spray each nostril twice a day as needed for stuffy nose Continue Mucinex [phone] mg twice a day and increase fluid intake to thin out mucus Continue ipratropium nasal spray 0.03% 2 sprays each nostril up to 3 times a day as needed for runny nose or drainage. Continue saline nasal rinses one to two times a day for drainage. Use this before any medicated nasal sprays   Reflux Continue dietary and lifestyle modifications as listed below Continue to follow up with GI specialist for further evaluation and treatment  Atopic dermatitis Continue a daily moisturizing routine Begin triamcinolone 0.1% ointment to red and  itchy areas below your face twice a day as needed  Call the clinic if this treatment plan is not working well for you  Follow up in 2 months or sooner if needed.  Return in about 2 months (around 07/29/2021), or if symptoms worsen or fail to improve.    Thank you for the opportunity to care for this patient.  Please do not hesitate to contact me with questions.  Thermon Leyland, FNP [facility]

## 2021-05-29 NOTE — Telephone Encounter (Signed)
Left patient a message to call the office to schedule a televisit.  Matalie [phone]

## 2021-05-29 NOTE — Patient Instructions (Addendum)
Asthma Continue Symbicort 160-2 puffs twice a day with spacer to help prevent cough and wheeze Continue Spiriva Respimat 1.25-use 2 puffs once a day to help prevent cough and wheeze. Do not use a spacer with this inhaler Continue montelukast 10 mg once a day to help prevent cough and wheeze Continue Dupixent injections once every 14 days and have access to an epinephrine auto-injector set May use Ventolin 2 puffs every 4 hours as needed for cough, wheeze, tightness in chest, or shortness of breath For asthma flare, begin Flovent 110-2 puffs twice a day with a spacer for 2 weeks or until cough and wheeze free  Suspected viral/ non-bacterial sinusitis Begin prednisone 10 mg tablets. Take 2 tablets twice a day for 3 days, then take 2 tablets once a day for 1 day, then take 1 tablet on the 5th day, then stop  Increase saline nasal rinses Continue a steroid nasal spray 2 sprays in each nostril daily Stop Allegra for the next 5 days Continue Mucinex [phone] mg twice a day and increase fluid intake to thin mucus Call the clinic if you develop a fever, your symptoms worsen, or do not resolve.   Seasonal and perennial allergic rhinitis Continue Allegra 180 mg once a day as needed for runny nose Continue Nasacort 1 spray each nostril twice a day as needed for stuffy nose Continue Mucinex [phone] mg twice a day and increase fluid intake to thin out mucus Continue ipratropium nasal spray 0.03% 2 sprays each nostril up to 3 times a day as needed for runny nose or drainage. Continue saline nasal rinses one to two times a day for drainage. Use this before any medicated nasal sprays   Reflux Continue dietary and lifestyle modifications as listed below Continue to follow up with GI specialist for further evaluation and treatment  Atopic dermatitis Continue a daily moisturizing routine Begin triamcinolone 0.1% ointment to red and itchy areas below your face twice a day as needed  Call the clinic if  this treatment plan is not working well for you  Follow up in 2 months or sooner if needed.

## 2021-05-30 ENCOUNTER — Telehealth: Payer: Self-pay | Admitting: Family Medicine

## 2021-05-30 ENCOUNTER — Telehealth: Payer: Self-pay

## 2021-05-30 DIAGNOSIS — J019 Acute sinusitis, unspecified: Secondary | ICD-10-CM | POA: Insufficient documentation

## 2021-05-30 NOTE — Telephone Encounter (Signed)
Noted thank you

## 2021-05-30 NOTE — Telephone Encounter (Signed)
Ambs, Norvel Richards, FNP  [facility] [place] [facility] Can you please ask Bjorn Loser where she wants her prescriptions to go? Montelukast, Spiriva, epinephrine auto-injector set, and albuterol? Thank you   Lm for  pt to call us back to confirm pharmacy

## 2021-05-30 NOTE — Telephone Encounter (Signed)
Patient returned a call from Rosana Fret CMA needed Alaira to verify the pharmacy preferred Bridget Bell states she uses [facility] please advise

## 2021-05-30 NOTE — Telephone Encounter (Signed)
Can you please order Montelukast, Spiriva, epinephrine auto-injector set, and albuterol refills? Thank you

## 2021-05-30 NOTE — Telephone Encounter (Signed)
[facility] pharmacy per pt

## 2021-05-31 ENCOUNTER — Other Ambulatory Visit: Payer: Self-pay | Admitting: Family Medicine

## 2021-05-31 MED ORDER — SPIRIVA RESPIMAT 1.25 MCG/ACT IN AERS: INHALATION_SPRAY | RESPIRATORY_TRACT | 5 refills | Status: DC

## 2021-05-31 MED ORDER — EPINEPHRINE 0.3 MG/0.3ML IJ SOAJ: 0.3000 mg | Freq: Once | INTRAMUSCULAR | 1 refills | Status: AC

## 2021-05-31 MED ORDER — MONTELUKAST SODIUM 10 MG PO TABS: ORAL_TABLET | ORAL | 1 refills | Status: DC

## 2021-05-31 MED ORDER — ALBUTEROL SULFATE HFA 108 (90 BASE) MCG/ACT IN AERS: 2.0000 | INHALATION_SPRAY | RESPIRATORY_TRACT | 0 refills | Status: DC | PRN

## 2021-05-31 NOTE — Addendum Note (Signed)
Addended by: Berna Bue on: 05/31/2021 08:40 AM   Modules accepted: Orders

## 2021-05-31 NOTE — Telephone Encounter (Signed)
Sent in montelukast, spiriva, epipen, and albuterol hfa to [facility]

## 2021-06-06 ENCOUNTER — Ambulatory Visit (INDEPENDENT_AMBULATORY_CARE_PROVIDER_SITE_OTHER): Payer: BC Managed Care – PPO

## 2021-06-06 DIAGNOSIS — J309 Allergic rhinitis, unspecified: Secondary | ICD-10-CM | POA: Diagnosis not present

## 2021-06-27 ENCOUNTER — Ambulatory Visit (INDEPENDENT_AMBULATORY_CARE_PROVIDER_SITE_OTHER): Payer: BC Managed Care – PPO

## 2021-06-27 DIAGNOSIS — J309 Allergic rhinitis, unspecified: Secondary | ICD-10-CM | POA: Diagnosis not present

## 2021-07-04 ENCOUNTER — Ambulatory Visit (INDEPENDENT_AMBULATORY_CARE_PROVIDER_SITE_OTHER): Payer: BC Managed Care – PPO

## 2021-07-04 DIAGNOSIS — J309 Allergic rhinitis, unspecified: Secondary | ICD-10-CM | POA: Diagnosis not present

## 2021-08-01 ENCOUNTER — Ambulatory Visit: Payer: Self-pay

## 2021-08-02 ENCOUNTER — Other Ambulatory Visit: Payer: Self-pay | Admitting: Family Medicine

## 2021-08-03 NOTE — Progress Notes (Signed)
LABELS TO DILUTE 

## 2021-08-08 ENCOUNTER — Ambulatory Visit (INDEPENDENT_AMBULATORY_CARE_PROVIDER_SITE_OTHER): Payer: BC Managed Care – PPO

## 2021-08-08 DIAGNOSIS — J309 Allergic rhinitis, unspecified: Secondary | ICD-10-CM

## 2021-08-15 ENCOUNTER — Ambulatory Visit (INDEPENDENT_AMBULATORY_CARE_PROVIDER_SITE_OTHER): Payer: BC Managed Care – PPO

## 2021-08-15 DIAGNOSIS — J309 Allergic rhinitis, unspecified: Secondary | ICD-10-CM

## 2021-08-21 ENCOUNTER — Telehealth: Payer: Self-pay

## 2021-08-21 NOTE — Telephone Encounter (Signed)
Can you please call in Prednisone 10 mg tablets. Take 2 tablets once a day for 4 days, then take 1 tablet on the 5th day, then stop.  Please have her call the clinic with any worsening symptoms or with any questions.  Please have her make a follow-up appointment in 1 to 2 months.  Thank you

## 2021-08-21 NOTE — Telephone Encounter (Signed)
Bridget Bell called stating she has an upper respiratory infection, she woke up Saturday with a bad headache and her head was clogged, she did a nasal rinse and it went straight to her ears. She is congestion with pressure behind her eyes and forehead. She is requesting a script for prednisone. Pharmacy is [facility] in [place]

## 2021-08-22 ENCOUNTER — Encounter: Payer: Self-pay | Admitting: Family

## 2021-08-22 ENCOUNTER — Other Ambulatory Visit: Payer: Self-pay

## 2021-08-22 ENCOUNTER — Ambulatory Visit: Payer: BC Managed Care – PPO | Admitting: Family

## 2021-08-22 VITALS — BP 126/76 | HR 112 | Temp 98.2°F | Resp 18 | Wt 204.4 lb

## 2021-08-22 DIAGNOSIS — B9789 Other viral agents as the cause of diseases classified elsewhere: Secondary | ICD-10-CM

## 2021-08-22 DIAGNOSIS — J4541 Moderate persistent asthma with (acute) exacerbation: Secondary | ICD-10-CM

## 2021-08-22 DIAGNOSIS — K219 Gastro-esophageal reflux disease without esophagitis: Secondary | ICD-10-CM

## 2021-08-22 DIAGNOSIS — J3089 Other allergic rhinitis: Secondary | ICD-10-CM

## 2021-08-22 DIAGNOSIS — J019 Acute sinusitis, unspecified: Secondary | ICD-10-CM

## 2021-08-22 DIAGNOSIS — J302 Other seasonal allergic rhinitis: Secondary | ICD-10-CM

## 2021-08-22 DIAGNOSIS — K449 Diaphragmatic hernia without obstruction or gangrene: Secondary | ICD-10-CM

## 2021-08-22 MED ORDER — TRIAMCINOLONE ACETONIDE 0.1 % EX OINT: TOPICAL_OINTMENT | CUTANEOUS | 0 refills | Status: DC

## 2021-08-22 MED ORDER — TRIAMCINOLONE ACETONIDE 55 MCG/ACT NA AERO: INHALATION_SPRAY | NASAL | 1 refills | Status: DC

## 2021-08-22 NOTE — Patient Instructions (Addendum)
Asthma Start prednisone 10 mg taking 2 tablets twice a day, for 3 days, then on the fourth day take 2 tablets in the morning, and on the fifth day take 1 tablet and stop Continue Symbicort 160/4.5 mcg-2 puffs twice a day with spacer to help prevent cough and wheeze Continue Spiriva Respimat 1.25-use 2 puffs once a day to help prevent cough and wheeze.  Continue montelukast 10 mg once a day to help prevent cough and wheeze Continue Dupixent injections once every 14 days and have access to an epinephrine auto-injector set May use Ventolin 2 puffs every 4 hours as needed for cough, wheeze, tightness in chest, or shortness of breath For asthma flare, begin Flovent 110-2 puffs twice a day with a spacer for 2 weeks or until cough and wheeze free.   Seasonal and perennial allergic rhinitis Take prednisone as above.  We will hold off on giving you an antibiotic at this time since her symptoms have only been going on for 2 to 3 days.  You can give our office a phone call if the symptoms persist. Recommend getting checked for COVID-19. Continue Allegra 180 mg once a day as needed for runny nose Re-start Nasacort 1 spray each nostril twice a day as needed for stuffy nose. Prescription sent Continue Mucinex [phone] mg twice a day and increase fluid intake to thin out mucus Continue ipratropium nasal spray 0.03% 2 sprays each nostril up to 3 times a day as needed for runny nose or drainage. Continue saline nasal rinses one to two times a day for drainage. Use this before any medicated nasal sprays   Reflux Continue dietary and lifestyle modifications as listed below Continue to follow up with GI specialist for further evaluation and treatment  Atopic dermatitis Continue a daily moisturizing routine May use triamcinolone 0.1% ointment to red and itchy areas below your face twice a day as needed  Call the clinic if this treatment plan is not working well for you  Follow up in 3 months or sooner if  needed.

## 2021-08-22 NOTE — Progress Notes (Signed)
[address] [place] [address] Dept: [phone]  FOLLOW UP NOTE  Patient ID: Bridget Bell, female    DOB: February 15, 1961  Age: 61 y.o. MRN: [id number] Date of Office Visit: 08/22/2021  Assessment  Chief Complaint: Follow-up, Cough, Asthma, and Allergic Rhinitis   HPI Bridget Bell is a 61 year old female who presents today for  an acute visit.  She was last seen on May 29, 2021 by Thermon Leyland, FNP for moderate persistent asthma, seasonal and perennial allergic rhinitis, gastroesophageal reflux disease, hiatal hernia, and acute viral sinusitis.  Since her last office visit she denies any new diagnosis or surgeries, but reports she is in the process of trying to get surgery for her acid reflux and has to lose 20 pounds first.  Moderate persistent asthma is reported as not well controlled with Symbicort 160/4.5 mcg using 2 puffs twice a day with spacer, Spiriva Respimat 1.25 mcg 2 puffs once a day, Singulair 10 mg once a day, Dupixent injections every 14 days, Ventolin as needed, and Flovent 110 2 puffs twice a day with spacer for 2 weeks with asthma flares.  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  She reports a productive cough with green sputum for the past 2 to 3 days, wheezing, tightness in her chest, shortness of breath, and nocturnal awakenings due to breathing problems.  She is having to sleep in small intervals due to the cough.  Since her last office visit she denies any other systemic steroids..  She reports that she normally does not have to use her albuterol when asked how often she uses her albuterol inhaler.  She denies fever and body aches, but reports that she had chills last night.  She has not been tested for COVID-19 and reports that her symptoms were different to when she has had COVID-19 in the past.  Seasonal and perennial allergic rhinitis  is reported as not well controlled with Allegra 180 mg once a day, Mucinex twice a day, saline rinses as needed, and saline mist with eucalyptus  as needed.  She does not think that she has Nasacort nasal spray.  She also mentions that she is not able to use ipratropium nasal spray due to her nose being clogged up.  She tried using her saline rinse the other day and the rinse went straight to her ears and she got dizzy.  When she got the water out she felt better.  She reports lots of green rhinorrhea for the past 3 days, nasal congestion, and she is sure that she has postnasal drip, but she has not tasted it.  She denies any sinus infections since we last saw her.  She thinks that she has maybe had 1 or 2 sinus infections over the past year.  Reflux is reported as not well controlled with Aciphex and famotidine.  She continues to follow-up with GI and is in the process of trying to have "acid reflux surgery".  She does need to lose 20 pounds first though she reports.  Atopic dermatitis is reported as moderately controlled.  She does not remember getting triamcinolone ointment at the last office visit.  She reports that she was broken out on her back, but now has 1 little spot.   Drug Allergies:  Allergies  Allergen Reactions   Amoxicillin-Pot Clavulanate Other (See Comments)  Real bad yeast infection    Review of Systems: Review of Systems  Constitutional:  Positive for chills. Negative for fever.       Reports chills last night and denies fevers.  HENT:         Reports a lot of green rhinorrhea the past 2 to 3 days and nasal congestion.  She reports that she is sure that she has postnasal drip, but is not sure  Eyes:        Denies itchy watery eyes  Respiratory:  Positive for cough, shortness of breath and wheezing.        Reports productive cough with green sputum for the past 2 to 3 days.  Also reports wheezing, tightness in her chest, shortness of breath, and nocturnal awakenings due to  breathing  Cardiovascular:  Negative for chest pain and palpitations.  Gastrointestinal:        Reports reflux with Aciphex and famotidine.  Currently seeing GI and is in the process of trying to have acid reflux surgery.  Genitourinary:  Negative for frequency.  Musculoskeletal:  Negative for myalgias.  Skin:  Positive for itching.       Reports 1 itchy spot on her back  Neurological:  Negative for headaches.  Endo/Heme/Allergies:  Positive for environmental allergies.    Physical Exam: BP 126/76    Pulse (!) 112    Temp 98.2 F (36.8 C) (Temporal)    Resp 18    Wt 204 lb 6.4 oz (92.7 kg)    SpO2 97%    BMI 36.21 kg/m    Physical Exam Constitutional:      Appearance: Normal appearance.  HENT:     Head: Normocephalic and atraumatic.     Comments: Pharynx normal, eyes normal, ears normal, nose: Bilateral lower turbinates mildly edematous slightly erythematous with no drainage noted    Right Ear: Tympanic membrane, ear canal and external ear normal.     Left Ear: Tympanic membrane, ear canal and external ear normal.     Mouth/Throat:     Mouth: Mucous membranes are moist.     Pharynx: Oropharynx is clear.  Eyes:     Conjunctiva/sclera: Conjunctivae normal.  Cardiovascular:     Rate and Rhythm: Regular rhythm.     Heart sounds: Normal heart sounds.  Pulmonary:     Effort: Pulmonary effort is normal.     Breath sounds: Normal breath sounds.     Comments: Lungs clear to auscultation Musculoskeletal:     Cervical back: Neck supple.  Skin:    General: Skin is warm.  Neurological:     Mental Status: She is alert and oriented to person, place, and time.  Psychiatric:        Mood and Affect: Mood normal.        Behavior: Behavior normal.        Thought Content: Thought content normal.        Judgment: Judgment normal.    Diagnostics: Will get spirometry at her next office visit due to coughing  Assessment and Plan: 1. Moderate persistent asthma with acute exacerbation    2. Acute viral sinusitis   3. Seasonal and perennial allergic rhinitis   4. Gastroesophageal reflux disease without esophagitis   5. Hiatal hernia     Meds ordered this encounter  Medications   triamcinolone ointment (KENALOG) 0.1 %    Sig: Use 1 application twice a day as needed to red itchy areas.  Do not use on face, neck,  groin, or armpit region    Dispense:  30 g    Refill:  0   triamcinolone (NASACORT) 55 MCG/ACT AERO nasal inhaler    Sig: 1 spray per nostril twice a day if needed for stuffy nose    Dispense:  3 each    Refill:  1    Patient Instructions  Asthma Start prednisone 10 mg taking 2 tablets twice a day, for 3 days, then on the fourth day take 2 tablets in the morning, and on the fifth day take 1 tablet and stop Continue Symbicort 160/4.5 mcg-2 puffs twice a day with spacer to help prevent cough and wheeze Continue Spiriva Respimat 1.25-use 2 puffs once a day to help prevent cough and wheeze.  Continue montelukast 10 mg once a day to help prevent cough and wheeze Continue Dupixent injections once every 14 days and have access to an epinephrine auto-injector set May use Ventolin 2 puffs every 4 hours as needed for cough, wheeze, tightness in chest, or shortness of breath For asthma flare, begin Flovent 110-2 puffs twice a day with a spacer for 2 weeks or until cough and wheeze free.   Seasonal and perennial allergic rhinitis Take prednisone as above.  We will hold off on giving you an antibiotic at this time since her symptoms have only been going on for 2 to 3 days.  You can give our office a phone call if the symptoms persist. Recommend getting checked for COVID-19. Continue Allegra 180 mg once a day as needed for runny nose Re-start Nasacort 1 spray each nostril twice a day as needed for stuffy nose. Prescription sent Continue Mucinex [phone] mg twice a day and increase fluid intake to thin out mucus Continue ipratropium nasal spray 0.03% 2 sprays each nostril  up to 3 times a day as needed for runny nose or drainage. Continue saline nasal rinses one to two times a day for drainage. Use this before any medicated nasal sprays   Reflux Continue dietary and lifestyle modifications as listed below Continue to follow up with GI specialist for further evaluation and treatment  Atopic dermatitis Continue a daily moisturizing routine May use triamcinolone 0.1% ointment to red and itchy areas below your face twice a day as needed  Call the clinic if this treatment plan is not working well for you  Follow up in 3 months or sooner if needed.   Return in about 3 months (around 11/19/2021), or if symptoms worsen or fail to improve.    Thank you for the opportunity to care for this patient.  Please do not hesitate to contact me with questions.  Nehemiah Settle, FNP [facility]

## 2021-08-22 NOTE — Telephone Encounter (Signed)
Patient called office this morning. Advised by Thermon Leyland, FNP to come in today for an OV.

## 2021-08-22 NOTE — Telephone Encounter (Signed)
Patient did not have Prednisone called into pharmacy but now thinks she needs stronger pak as well as a z-pak.  Per Thurston Hole FNP she wants patient seen so appointment was made for today.

## 2021-08-24 ENCOUNTER — Other Ambulatory Visit: Payer: Self-pay | Admitting: Family Medicine

## 2021-08-24 ENCOUNTER — Telehealth: Payer: Self-pay

## 2021-08-24 MED ORDER — AZITHROMYCIN 250 MG PO TABS: ORAL_TABLET | ORAL | 0 refills | Status: DC

## 2021-08-24 NOTE — Telephone Encounter (Signed)
Spoke to patient and she informed me that she was seen in the office by Chrissy and she is already on a Prednisone Pack since Tuesday. She stated she has taken 10 tablets so far and can breath better but she's still blowing out green dark thick mucus and has until Saturday with the last tablet to take. I informed her to call us Monday and let us know how she's feeling.   [name] [phone]

## 2021-08-24 NOTE — Telephone Encounter (Signed)
Thurston Hole will you please review my note taken earlier. Thank you

## 2021-08-24 NOTE — Telephone Encounter (Signed)
Can you please ask if she has a fever? If she has a fever please have her get a test for influenza and covid. If she does not have a fever she does not need to miss work today or tomorrow. Thank you

## 2021-08-24 NOTE — Addendum Note (Signed)
Addended by: Florence Canner on: 08/24/2021 11:50 AM   Modules accepted: Orders

## 2021-08-24 NOTE — Telephone Encounter (Signed)
Patient calling still not feeling well enough to go to work today or tomorrow. Patient would like a return work note for 08/28/2021 which would be Monday. Would that be ok to send a work note to pt.'s work place.  Patient states she's still coughing up and blowing out green. You saw this patient on Tuesday. Please advise.

## 2021-08-24 NOTE — Telephone Encounter (Signed)
Patient states she doesn't ever run a fever when she has had covid 3 years ago and when she had covid  6 months ago. Patient wants to know can she just have a note stating she has been coughing and blowing out green and an antibiotic has been called in, not that we gave her permission to stay home today or tomorrow.

## 2021-08-24 NOTE — Telephone Encounter (Signed)
Patient hasn't ever run a fever for when she had covid 3 years ago and then this past August. She didn't want to go get a influenza or covid test. I told her we could not give a note for her being out of work today or tomorrow, but Bridget Bell said we could write a note that she received an antibiotic for symptoms consistent with bacterial sinusitis.The letter was scanned and emailed to Bridget Bell's place of employment. Bridget Bell will call her place of employment tomorrow to make sure the letter has been received.

## 2021-08-24 NOTE — Progress Notes (Signed)
Patient reports relief with her breathing, however, reports an increase in her symptoms of sinusitis. She denies fever, sweats, and chills. Patient advised to get swabbed for influenza and COVID at this time. She is asking for a written note from our office to be out of work for today and tomorrow, however, she does not have a fever and is not interested in getting a test for flu or COVID. With her asthma symptoms resolving and no report of a fever, there is not a reason to be out of work at this time. She will take azithromycin as prescribed and contact the clinic with any worsening of symptoms or questions. A note was provided stating that she has been provided with an antibiotic for symptoms consistent with bacterial sinusitis.

## 2021-08-24 NOTE — Telephone Encounter (Signed)
I am off today can you please send to another provider that is in the office.  Thank you!

## 2021-08-24 NOTE — Telephone Encounter (Addendum)
Patient hasn't ever run a fever for when she had covid 3 years ago and then this past August. She didn't want to go get a influenza or covid test. I told her we could not give a note for her being out of work today or tomorrow, but Thurston Hole said we could write a note that she received an antibiotic for symptoms consistent with bacterial sinusitis.The letter was scanned and emailed to Alexa's place of employment. Whitney Post will call her place of employment tomorrow to make sure the letter has been received. Patient was also sent in a z-pak. Patient is aware.

## 2021-08-24 NOTE — Telephone Encounter (Signed)
Can you please ask her if she is having a fever?

## 2021-08-24 NOTE — Telephone Encounter (Signed)
Bridget Bell also sent in a z-pak. Patient is aware.

## 2021-08-24 NOTE — Telephone Encounter (Signed)
Can you please write a note that states that she received an antibiotic for symptoms consistent with bacterial sinusitis? Thank you

## 2021-08-25 NOTE — Telephone Encounter (Signed)
Called the school [phone] and spoke with front desk- they did receive the letter.

## 2021-08-29 ENCOUNTER — Ambulatory Visit (INDEPENDENT_AMBULATORY_CARE_PROVIDER_SITE_OTHER): Payer: BC Managed Care – PPO

## 2021-08-29 DIAGNOSIS — J309 Allergic rhinitis, unspecified: Secondary | ICD-10-CM | POA: Diagnosis not present

## 2021-09-06 ENCOUNTER — Ambulatory Visit (INDEPENDENT_AMBULATORY_CARE_PROVIDER_SITE_OTHER): Payer: BC Managed Care – PPO

## 2021-09-06 DIAGNOSIS — J309 Allergic rhinitis, unspecified: Secondary | ICD-10-CM

## 2021-09-12 ENCOUNTER — Ambulatory Visit (INDEPENDENT_AMBULATORY_CARE_PROVIDER_SITE_OTHER): Payer: BC Managed Care – PPO

## 2021-09-12 DIAGNOSIS — J309 Allergic rhinitis, unspecified: Secondary | ICD-10-CM | POA: Diagnosis not present

## 2021-09-19 ENCOUNTER — Ambulatory Visit (INDEPENDENT_AMBULATORY_CARE_PROVIDER_SITE_OTHER): Payer: BC Managed Care – PPO

## 2021-09-19 DIAGNOSIS — J309 Allergic rhinitis, unspecified: Secondary | ICD-10-CM

## 2021-09-26 ENCOUNTER — Ambulatory Visit (INDEPENDENT_AMBULATORY_CARE_PROVIDER_SITE_OTHER): Payer: BC Managed Care – PPO

## 2021-09-26 DIAGNOSIS — J309 Allergic rhinitis, unspecified: Secondary | ICD-10-CM

## 2021-10-04 ENCOUNTER — Ambulatory Visit (INDEPENDENT_AMBULATORY_CARE_PROVIDER_SITE_OTHER): Payer: BC Managed Care – PPO

## 2021-10-04 DIAGNOSIS — J309 Allergic rhinitis, unspecified: Secondary | ICD-10-CM

## 2021-10-10 ENCOUNTER — Ambulatory Visit (INDEPENDENT_AMBULATORY_CARE_PROVIDER_SITE_OTHER): Payer: BC Managed Care – PPO

## 2021-10-10 DIAGNOSIS — J309 Allergic rhinitis, unspecified: Secondary | ICD-10-CM | POA: Diagnosis not present

## 2021-10-17 ENCOUNTER — Ambulatory Visit (INDEPENDENT_AMBULATORY_CARE_PROVIDER_SITE_OTHER): Payer: BC Managed Care – PPO

## 2021-10-17 DIAGNOSIS — J309 Allergic rhinitis, unspecified: Secondary | ICD-10-CM

## 2021-10-24 ENCOUNTER — Ambulatory Visit (INDEPENDENT_AMBULATORY_CARE_PROVIDER_SITE_OTHER): Payer: BC Managed Care – PPO

## 2021-10-24 DIAGNOSIS — J309 Allergic rhinitis, unspecified: Secondary | ICD-10-CM

## 2021-10-31 ENCOUNTER — Ambulatory Visit (INDEPENDENT_AMBULATORY_CARE_PROVIDER_SITE_OTHER): Payer: BC Managed Care – PPO

## 2021-10-31 DIAGNOSIS — J309 Allergic rhinitis, unspecified: Secondary | ICD-10-CM | POA: Diagnosis not present

## 2021-11-04 ENCOUNTER — Other Ambulatory Visit: Payer: Self-pay | Admitting: Family Medicine

## 2021-11-07 ENCOUNTER — Ambulatory Visit (INDEPENDENT_AMBULATORY_CARE_PROVIDER_SITE_OTHER): Payer: BC Managed Care – PPO

## 2021-11-07 DIAGNOSIS — J309 Allergic rhinitis, unspecified: Secondary | ICD-10-CM

## 2021-11-14 ENCOUNTER — Ambulatory Visit (INDEPENDENT_AMBULATORY_CARE_PROVIDER_SITE_OTHER): Payer: BC Managed Care – PPO

## 2021-11-14 ENCOUNTER — Other Ambulatory Visit: Payer: Self-pay

## 2021-11-14 DIAGNOSIS — J309 Allergic rhinitis, unspecified: Secondary | ICD-10-CM

## 2021-11-14 MED ORDER — TRIAMCINOLONE ACETONIDE 55 MCG/ACT NA AERO: INHALATION_SPRAY | NASAL | 0 refills | Status: DC

## 2021-11-14 NOTE — Telephone Encounter (Signed)
Ok to refill Nasacort nasal spray

## 2021-11-15 NOTE — Patient Instructions (Addendum)
Asthma ?Continue Symbicort 160/4.5 mcg-2 puffs twice a day with spacer to help prevent cough and wheeze ?Continue Spiriva Respimat 1.25-use 2 puffs once a day to help prevent cough and wheeze.  ?Continue montelukast 10 mg once a day to help prevent cough and wheeze ?Continue Dupixent injections once every 14 days and have access to an epinephrine auto-injector set ?May use Ventolin 2 puffs every 4 hours as needed for cough, wheeze, tightness in chest, or shortness of breath ?For asthma flare, begin Flovent 110-2 puffs twice a day with a spacer for 2 weeks or until cough and wheeze free.  ? ?Seasonal and perennial allergic rhinitis ?Continue allergy injections per protocol. We will send a refill on your epinephrine auto-injector device. ?Continue Allegra 180 mg once a day as needed for runny nose ?Continue Nasacort 1 spray each nostril twice a day as needed for stuffy nose. Prescription sent ?Continue Mucinex [phone] mg twice a day and increase fluid intake to thin out mucus ?Restart ipratropium nasal spray 0.03% 1 spray each nostril once a day as needed for runny nose or drainage. This medication can be drying. Let us know if it is. ?Continue saline nasal rinses one to two times a day for drainage. Use this before any medicated nasal sprays  ? ?Reflux ?Continue dietary and lifestyle modifications as listed below ?Continue current regimen as per GI ?Continue to follow up with GI specialist for further evaluation and treatment ? ?Atopic dermatitis/rash ?Continue a daily moisturizing routine ?May use triamcinolone 0.1% ointment to red and itchy areas below your face twice a day as needed ?Recommend following up with dermatology concerning the rash on your back ? ?Call the clinic if this treatment plan is not working well for you ? ?Follow up in 3-4 months or sooner if needed. ? ?

## 2021-11-16 ENCOUNTER — Encounter: Payer: Self-pay | Admitting: Family

## 2021-11-16 ENCOUNTER — Ambulatory Visit: Payer: BC Managed Care – PPO | Admitting: Family

## 2021-11-16 VITALS — BP 118/72 | HR 103 | Ht 63.78 in | Wt 214.2 lb

## 2021-11-16 DIAGNOSIS — J4541 Moderate persistent asthma with (acute) exacerbation: Secondary | ICD-10-CM

## 2021-11-16 DIAGNOSIS — R053 Chronic cough: Secondary | ICD-10-CM | POA: Diagnosis not present

## 2021-11-16 DIAGNOSIS — R21 Rash and other nonspecific skin eruption: Secondary | ICD-10-CM

## 2021-11-16 DIAGNOSIS — K449 Diaphragmatic hernia without obstruction or gangrene: Secondary | ICD-10-CM

## 2021-11-16 DIAGNOSIS — J3089 Other allergic rhinitis: Secondary | ICD-10-CM | POA: Diagnosis not present

## 2021-11-16 DIAGNOSIS — J302 Other seasonal allergic rhinitis: Secondary | ICD-10-CM

## 2021-11-16 DIAGNOSIS — K219 Gastro-esophageal reflux disease without esophagitis: Secondary | ICD-10-CM | POA: Diagnosis not present

## 2021-11-16 MED ORDER — EPINEPHRINE 0.3 MG/0.3ML IJ SOAJ
INTRAMUSCULAR | 1 refills | Status: DC
Start: 2021-11-16 — End: 2022-08-23

## 2021-11-16 MED ORDER — IPRATROPIUM BROMIDE 0.03 % NA SOLN: NASAL | 1 refills | Status: DC

## 2021-11-16 MED ORDER — TRIAMCINOLONE ACETONIDE 55 MCG/ACT NA AERO: INHALATION_SPRAY | NASAL | 0 refills | Status: DC

## 2021-11-16 NOTE — Progress Notes (Signed)
? ?[address] ?[place] [address] ?Dept: [phone] ? ?FOLLOW UP NOTE ? ?Patient ID: Bridget Bell, female    DOB: 1960/07/27  Age: 61 y.o. MRN: [id number] ?Date of Office Visit: 11/16/2021 ? ?Assessment  ?Chief Complaint: Follow-up (Pt is present for six mont follow up. Pt is also here for refills on her medications.) and Asthma (Pt stated her asthma been really good.) ? ?HPI ?Bridget Bell is a 61 year old female who presents today for follow-up of moderate persistent asthma with acute exacerbation, acute viral sinusitis, seasonal and perennial allergic rhinitis, gastroesophageal reflux disease, and hiatal hernia.  She was last seen on August 22, 2021 by myself.  Since her last office visit she denies any new diagnosis or surgeries. She is trying to lose 20 pounds so she can have hiatal hernia surgery and transoral incisionless fundoplication. ? ?Moderate persistent asthma is reported as moderately controlled with Symbicort 160/4.5 mcg 2 puffs twice a day with spacer, Spiriva Respimat 1.25 mcg 2 puffs once a day, Singulair 10 mg once a day, Dupixent injections every 14 days, albuterol as needed, and Flovent 110 mcg for asthma flares.  She reports a productive cough with clear sputum that she feels is due to reflux.  She will cough at night.  She also reports wheezing sometimes that has been more since it is springtime but is not causing her to have labored breathing.  She denies fever, chills, tightness in her chest and shortness of breath.  Since her last office visit she has not required any systemic steroids or made any trips to the emergency room or urgent care due to breathing problems.  She has not had to use Flovent 110 mcg for asthma flares.  She has not had to use her albuterol inhaler as we last saw her.  She feels like her Dupixent injections help with her asthma.  She can tell a big difference with Dupixent.  She denies any reactions with her Dupixent.  She mentions that she is previously on  [place] and Xolair and they did not help a whole lot with her asthma. ? ?Seasonal and perennial allergic rhinitis is reported as moderately controlled with allergy injections that she recently restarted, Allegra 180 mg once a day, Nasacort 1 spray each nostril twice a day, Mucinex once a day and saline rinses as needed.  She is not been using ipratropium nasal spray.  She thinks that someone may have had her stop it in the past, but she is not sure why.  She is willing to retry 1 spray each nostril once a day to see if that helps with the postnasal drip.  She denies any problems with her allergy injections.  She does report that sometimes she does have small itchy areas.  Instructed her to let the injection room nurse know of any reactions.  Her epinephrine autoinjector device is not up-to-date.  She mentions that Allegra is the only antihistamine that will help her breathe through her nose.  She has tried others and they do not work.  She reports clear rhinorrhea, nasal congestion, and postnasal drip.  She has not had any sinus infections since we last saw her. ? ?She continues to take Aciphex twice a day and famotidine once a day to twice a day for her reflux.  She mentions that she had to go 11 days without her medications for testing and she did not realize how bad her heartburn and reflux were.  She feels that her acid reflux is  the cause of her cough. ? ?Atopic dermatitis is reported as not well controlled.  She reports that Dupixent has not really helped with the area on her back.  She does not feel like this is eczema.  She r has had this rash on her back for several years and it will come and go.  It will usually go away for a couple weeks and then will come back.  It has been on her abdomen region also.  She has seen dermatology for this rash and was given a cream. She mentions that they did not know what the rash was.  What she finds that works best the rash is using a colloidal oatmeal soap and lotion.   When she uses this combination for a week the rash will go away for a week or 2 at a time.  The rash can be itchy and will burn if she scratches at it. ? ? ?Drug Allergies:  ?Allergies  ?Allergen Reactions  ? Amoxicillin-Pot Clavulanate Other (See Comments)  ?  Real bad yeast infection  ? ? ?Review of Systems: ?Review of Systems  ?Constitutional:  Negative for chills and fever.  ?HENT:    ?     Reports clear rhinorrhea, nasal congestion, and postnasal drip  ?Eyes:   ?     Reports occasional itchy watery eyes  ?Respiratory:  Positive for cough and wheezing. Negative for shortness of breath.   ?     Reports cough that can be productive with clear sputum that she feels is due to reflux and nocturnal awakenings due to cough.  She also has wheezing sometimes.  Denies tightness in chest and shortness of breath.  ?Cardiovascular:  Negative for chest pain and palpitations.  ?Gastrointestinal:   ?     Denies heartburn or reflux symptoms with Aciphex twice a day and famotidine 1-2 times a day  ?Genitourinary:  Negative for frequency.  ?Skin:  Positive for itching and rash.  ?     Reports itchy rash that will burn if she scratches it that has been occurring off and on for the past couple years.  ?Neurological:  Negative for headaches.  ?Endo/Heme/Allergies:  Positive for environmental allergies.  ? ? ?Physical Exam: ?BP 118/72   Pulse (!) 103   Ht 5' 3.78" (1.62 m)   Wt 214 lb 3.2 oz (97.2 kg)   SpO2 99%   BMI 37.02 kg/m?   ? ?Physical Exam ?Constitutional:   ?   Appearance: Normal appearance.  ?HENT:  ?   Head: Normocephalic and atraumatic.  ?   Comments: Pharynx erythematous with no exudate, eyes normal, ears normal, nose: Bilateral lower turbinates erythematous and slightly edematous with no drainage noted ?   Right Ear: Tympanic membrane, ear canal and external ear normal.  ?   Left Ear: Tympanic membrane, ear canal and external ear normal.  ?   Mouth/Throat:  ?   Mouth: Mucous membranes are moist.  ?   Pharynx:  Oropharynx is clear.  ?Eyes:  ?   Conjunctiva/sclera: Conjunctivae normal.  ?Cardiovascular:  ?   Rate and Rhythm: Regular rhythm. Tachycardia present.  ?   Heart sounds: Normal heart sounds.  ?Pulmonary:  ?   Effort: Pulmonary effort is normal.  ?   Breath sounds: Normal breath sounds.  ?   Comments: Lungs clear to auscultation ?Musculoskeletal:  ?   Cervical back: Neck supple.  ?Skin: ?   General: Skin is warm.  ?   Comments: Erythematous papular rash  noted on her back  ?Neurological:  ?   Mental Status: She is alert and oriented to person, place, and time.  ?Psychiatric:     ?   Mood and Affect: Mood normal.     ?   Behavior: Behavior normal.     ?   Thought Content: Thought content normal.     ?   Judgment: Judgment normal.  ? ? ?Diagnostics: ?FVC 2.90 L (98%), FEV1 2.39 L (103%).  Predicted FVC 2.96 L, predicted FEV1 2.32 L.  Spirometry indicates normal respiratory function. ? ?Assessment and Plan: ?1. Moderate persistent asthma with acute exacerbation   ?2. Seasonal and perennial allergic rhinitis   ?3. Gastroesophageal reflux disease without esophagitis   ?4. Hiatal hernia   ?5. Chronic cough   ?6. Rash   ? ? ?Meds ordered this encounter  ?Medications  ? triamcinolone (NASACORT) 55 MCG/ACT AERO nasal inhaler  ?  Sig: SPRAY 1 SPRAY INTO EACH NOSTRIL TWO TIMES DAILY IF NEEDED FOR STUFFY NOSE  ?  Dispense:  50.7 mL  ?  Refill:  0  ? EPINEPHrine (AUVI-Q) 0.3 mg/0.3 mL IJ SOAJ injection  ?  Sig: Use as directed for severe allergic reactions.  ?  Dispense:  2 each  ?  Refill:  1  ?  [phone]  ? ipratropium (ATROVENT) 0.03 % nasal spray  ?  Sig: Place 1 spray in each nostril once a day as needed for runny nose/drainage down throat  ?  Dispense:  30 mL  ?  Refill:  1  ? ? ?Patient Instructions  ?Asthma ?Continue Symbicort 160/4.5 mcg-2 puffs twice a day with spacer to help prevent cough and wheeze ?Continue Spiriva Respimat 1.25-use 2 puffs once a day to help prevent cough and wheeze.  ?Continue montelukast 10 mg  once a day to help prevent cough and wheeze ?Continue Dupixent injections once every 14 days and have access to an epinephrine auto-injector set ?May use Ventolin 2 puffs every 4 hours as needed for cough, wheeze,

## 2021-11-17 ENCOUNTER — Other Ambulatory Visit: Payer: Self-pay | Admitting: Family Medicine

## 2021-12-05 ENCOUNTER — Ambulatory Visit (INDEPENDENT_AMBULATORY_CARE_PROVIDER_SITE_OTHER): Payer: BC Managed Care – PPO

## 2021-12-05 DIAGNOSIS — J309 Allergic rhinitis, unspecified: Secondary | ICD-10-CM | POA: Diagnosis not present

## 2021-12-12 ENCOUNTER — Ambulatory Visit (INDEPENDENT_AMBULATORY_CARE_PROVIDER_SITE_OTHER): Payer: BC Managed Care – PPO

## 2021-12-12 DIAGNOSIS — J309 Allergic rhinitis, unspecified: Secondary | ICD-10-CM

## 2021-12-19 ENCOUNTER — Ambulatory Visit (INDEPENDENT_AMBULATORY_CARE_PROVIDER_SITE_OTHER): Payer: BC Managed Care – PPO

## 2021-12-19 DIAGNOSIS — J309 Allergic rhinitis, unspecified: Secondary | ICD-10-CM

## 2021-12-26 ENCOUNTER — Ambulatory Visit (INDEPENDENT_AMBULATORY_CARE_PROVIDER_SITE_OTHER): Payer: BC Managed Care – PPO

## 2021-12-26 DIAGNOSIS — J309 Allergic rhinitis, unspecified: Secondary | ICD-10-CM

## 2022-01-02 ENCOUNTER — Ambulatory Visit (INDEPENDENT_AMBULATORY_CARE_PROVIDER_SITE_OTHER): Payer: BC Managed Care – PPO

## 2022-01-02 DIAGNOSIS — J309 Allergic rhinitis, unspecified: Secondary | ICD-10-CM

## 2022-01-16 ENCOUNTER — Ambulatory Visit (INDEPENDENT_AMBULATORY_CARE_PROVIDER_SITE_OTHER): Payer: BC Managed Care – PPO

## 2022-01-16 DIAGNOSIS — J309 Allergic rhinitis, unspecified: Secondary | ICD-10-CM | POA: Diagnosis not present

## 2022-01-25 ENCOUNTER — Ambulatory Visit (INDEPENDENT_AMBULATORY_CARE_PROVIDER_SITE_OTHER): Payer: BC Managed Care – PPO

## 2022-01-25 DIAGNOSIS — J309 Allergic rhinitis, unspecified: Secondary | ICD-10-CM

## 2022-01-30 ENCOUNTER — Ambulatory Visit (INDEPENDENT_AMBULATORY_CARE_PROVIDER_SITE_OTHER): Payer: BC Managed Care – PPO

## 2022-01-30 DIAGNOSIS — J309 Allergic rhinitis, unspecified: Secondary | ICD-10-CM

## 2022-02-01 DIAGNOSIS — J302 Other seasonal allergic rhinitis: Secondary | ICD-10-CM

## 2022-02-01 NOTE — Progress Notes (Signed)
VIALS EXP 02-02-23 

## 2022-02-02 DIAGNOSIS — J3089 Other allergic rhinitis: Secondary | ICD-10-CM

## 2022-02-06 ENCOUNTER — Ambulatory Visit (INDEPENDENT_AMBULATORY_CARE_PROVIDER_SITE_OTHER): Payer: BC Managed Care – PPO

## 2022-02-06 DIAGNOSIS — J309 Allergic rhinitis, unspecified: Secondary | ICD-10-CM | POA: Diagnosis not present

## 2022-02-14 ENCOUNTER — Ambulatory Visit (INDEPENDENT_AMBULATORY_CARE_PROVIDER_SITE_OTHER): Payer: BC Managed Care – PPO

## 2022-02-14 DIAGNOSIS — J309 Allergic rhinitis, unspecified: Secondary | ICD-10-CM | POA: Diagnosis not present

## 2022-02-20 ENCOUNTER — Ambulatory Visit (INDEPENDENT_AMBULATORY_CARE_PROVIDER_SITE_OTHER): Payer: BC Managed Care – PPO

## 2022-02-20 DIAGNOSIS — J309 Allergic rhinitis, unspecified: Secondary | ICD-10-CM

## 2022-02-21 ENCOUNTER — Telehealth: Payer: Self-pay | Admitting: Family

## 2022-02-21 NOTE — Telephone Encounter (Signed)
Called patient to get more information and she stated, she didn't call the office about any medication today.

## 2022-02-21 NOTE — Telephone Encounter (Signed)
Pharmacy states Trelogy would be cheaper than the two meds being used now, asking if provider would consider changing meds.

## 2022-02-22 NOTE — Telephone Encounter (Signed)
Patient has an appointment with Dr. Maurine Minister on 02/26/22 will sent this to her to see if she would like to change medication.    Pharmacy states Trelogy would be cheaper than the two meds being used now, asking if provider would consider changing meds.

## 2022-02-25 NOTE — Progress Notes (Signed)
FOLLOW UP Date of Service/Encounter:  02/26/22  Subjective:  Bridget Bell (DOB: 1961/03/09) is a 61 y.o. female who [facility]22 in re-evaluation of the following: moderate persistent asthma, allergic rhinitis, GERD, atopic dermatitis History obtained from: chart review and patient.  For Review, LV was on 11/16/21  with Nehemiah Settle, FNP seen for routine follow-up. At that visit, she was continued on Symbicort 160 + Spiriva + montelukast + Dupixent for asthma.  Continued on AIT + allergra + ipatropium PRN + mucinex PRN. Following with GI on medications for reflux.   She has tried and failed Harrington Challenger, Xolair in the past. January 2018 she was started on Xolair.  In May 2018 she was started on Fasenra.  She started Dupixent in Sept 2020.  On AIT-had reached maintenance at continued on this at least from 2016-2019.  She did restart after taking a break, and is now receiving gold vial 0.25 mL, A schedule. Immune work-up in 2018: adequate strep titers following vaccine, adequate dipertheria and tetanus vaccine titers. Adeuqate CH50, normal quant immunoglobulins, IgE 522.  Today presents for follow-up. Asthma-due to increased humidity, she is having to use her albuterol more than normal, but otherwise feels her asthma is controlled.   Using rescue inhaler in the morning sometimes. She continues on Dupixent q14 days, home dosing  No issues, works wonders for her. GERD - She does cough a lot because she has uncontrolled reflux. She has been advised of bariatric surgery, but declined. She takes famotidine at night.  She stopped taking omeprazole because it stopped working.  She is on aciphex twice daily.  Prescribed by GI.  She may consider surgery to correct her hiatal hernia, but was told she needs to lose around 20 pounds prior to the surgery Seasonal and perennial allergic rhinitis-AIT doing well, not using ipatropium because it is overly drying. Uses Nasacort  twice a day.  Use allegra daily.  She uses mucinex twice daily.  Due for an injection today.   She does still have a rash that comes around her bra line intermittently and stays for about one week or so, itches and resolves with triamcinolone.    Allergies as of 02/26/2022       Reactions   Amoxicillin-pot Clavulanate Other (See Comments)   Real bad yeast infection        Medication List        Accurate as of February 26, 2022 12:20 PM. If you have any questions, ask your nurse or doctor.          STOP taking these medications    azithromycin 250 MG tablet Commonly known as: Zithromax Z-Pak Stopped by: Verlee Monte, MD       TAKE these medications    acetaminophen 650 MG CR tablet Commonly known as: TYLENOL Take 1,300 mg by mouth daily. What changed: Another medication with the same name was removed. Continue taking this medication, and follow the directions you see here. Changed by: Verlee Monte, MD   albuterol (2.5 MG/3ML) 0.083% nebulizer solution Commonly known as: PROVENTIL USE ONE VIAL IN NEBULIZER EVERY 4 TO 6 HOURS AS NEEDED FOR COUGH OR WHEEZE   albuterol 108 (90 Base) MCG/ACT inhaler Commonly known as: Ventolin HFA Inhale 2 puffs into the lungs every 4 (four) hours as needed for wheezing or shortness of breath.   amLODipine 5 MG tablet Commonly known as: NORVASC Take 5 mg by mouth daily. What changed: Another medication  with the same name was removed. Continue taking this medication, and follow the directions you see here. Changed by: Verlee Monte, MD   Biotin 10 MG Caps Take by mouth.   bupivacaine 0.25 % injection Commonly known as: MARCAINE Inject into the skin.   Calcium Carb-Cholecalciferol 600-800 MG-UNIT Tabs Take by mouth daily.   Cholecalciferol 125 MCG (5000 UT) Tabs Take by mouth.   CINNAMON PLUS CHROMIUM PO Take 1,000 mg by mouth 2 (two) times daily.   cyclobenzaprine 10 MG tablet Commonly known as: FLEXERIL Take 1 tablet  by mouth 2 (two) times daily as needed.   Dupixent 300 MG/2ML prefilled syringe Generic drug: dupilumab INJECT 300 MG (1 SYRINGE) UNDER THE SKIN EVERY 2 WEEKS What changed: Another medication with the same name was removed. Continue taking this medication, and follow the directions you see here. Changed by: Verlee Monte, MD   ECHINACEA PO Take 760 mg by mouth daily.   EPINEPHrine 0.3 mg/0.3 mL Soaj injection Commonly known as: Auvi-Q Use as directed for severe allergic reactions.   ESTER C PO Take 1,000 mg by mouth daily.   estradiol 0.5 MG tablet Commonly known as: ESTRACE Take 0.5 mg by mouth. What changed: Another medication with the same name was removed. Continue taking this medication, and follow the directions you see here. Changed by: Verlee Monte, MD   famotidine 40 MG tablet Commonly known as: PEPCID Take 40 mg by mouth at bedtime. What changed: Another medication with the same name was removed. Continue taking this medication, and follow the directions you see here. Changed by: Verlee Monte, MD   ferrous sulfate 325 (65 FE) MG tablet Take 325 mg by mouth. What changed: Another medication with the same name was removed. Continue taking this medication, and follow the directions you see here. Changed by: Verlee Monte, MD   fexofenadine 180 MG tablet Commonly known as: ALLEGRA Take 180 mg by mouth daily.   MUCINEX ALLERGY PO Take 1,600 mg by mouth daily. Sometimes patient takes it 3 times daily.   Flovent HFA 110 MCG/ACT inhaler Generic drug: fluticasone INHALE 2 PUFFS BY MOUTH TWO TIMES DAILY DURING ASTHMA FLARES   folic acid 800 MCG tablet Commonly known as: FOLVITE Take by mouth.   Ginger Root 550 MG Caps Take 550 mg by mouth daily.   GLUCOSAMINE CHONDROITIN MSM PO Take 1,500 mg by mouth daily.   hydrALAZINE 10 MG tablet Commonly known as: APRESOLINE Take 10 mg by mouth 3 (three) times daily.   hydrochlorothiazide 25 MG tablet Commonly known  as: HYDRODIURIL Take 50 mg by mouth daily.   ipratropium 0.03 % nasal spray Commonly known as: ATROVENT Place 1 spray in each nostril once a day as needed for runny nose/drainage down throat   KOREAN GINSENG PO Take 500 mg by mouth daily. Reported on 09/02/2015   losartan 100 MG tablet Commonly known as: COZAAR Take 100 mg by mouth daily. What changed: Another medication with the same name was removed. Continue taking this medication, and follow the directions you see here. Changed by: Verlee Monte, MD   losartan-hydrochlorothiazide 100-25 MG tablet Commonly known as: HYZAAR TAKE 1 TABLET BY MOUTH EVERY DAY   Magnesium 400 MG Caps Take by mouth daily.   magnesium gluconate 54mg /75ml syringe Take by mouth.   MAXIMUM RED KRILL PO Take 1,000 mg by mouth daily.   meloxicam 15 MG tablet Commonly known as: MOBIC Take 15 mg by mouth. What changed: Another  medication with the same name was removed. Continue taking this medication, and follow the directions you see here. Changed by: Verlee Monte, MD   montelukast 10 MG tablet Commonly known as: SINGULAIR TAKE 1 TABLET BY MOUTH NIGHTLY FOR COUGH OR FOR WHEEZING   MULTIVITAMIN WOMEN 50+ PO Take by mouth.   NONFORMULARY OR COMPOUNDED ITEM   omeprazole 40 MG capsule Commonly known as: PRILOSEC TAKE 1 CAPSULE BY MOUTH TWO TIMES DAILY TO PREVENT REFLUX   optichamber diamond Misc USE AS DIRECTED.   PAPAYA ENZYME PO Take 66 mg by mouth daily.   potassium chloride 10 MEQ tablet Commonly known as: KLOR-CON M TAKE ONE TABLET BY MOUTH EVERY DAY   RABEprazole 20 MG tablet Commonly known as: ACIPHEX Take 1 tablet by mouth 2 (two) times daily.   selenium 200 MCG Tabs tablet Take by mouth.   Spiriva Respimat 1.25 MCG/ACT Aers Generic drug: Tiotropium Bromide Monohydrate INHALE 2 PUFFS BY MOUTH EVERY DAY **MUST CALL MD FOR APPOINTMENT**   Symbicort 160-4.5 MCG/ACT inhaler Generic drug: budesonide-formoterol INHALE 2 PUFFS  BY MOUTH WITH SPACER EVERY 12 HOURS . RINSE, GARGLE AND SPIT OUT AFTER USE   triamcinolone 55 MCG/ACT Aero nasal inhaler Commonly known as: NASACORT SPRAY 1 SPRAY INTO EACH NOSTRIL TWO TIMES DAILY IF NEEDED FOR STUFFY NOSE   triamcinolone acetonide 40 MG/ML Susp Commonly known as: TRIESENCE Inject into the articular space.   triamcinolone ointment 0.1 % Commonly known as: KENALOG Use 1 application twice a day as needed to red itchy areas.  Do not use on face, neck, groin, or armpit region   vitamin E 1000 UNIT capsule Take by mouth.       Past Medical History:  Diagnosis Date   Arthritis    Asthma    Hiatal hernia 05/2020   Hypertension    History reviewed. No pertinent surgical history. Otherwise, there have been no changes to her past medical history, surgical history, family history, or social history.  ROS: All others negative except as noted per HPI.   Objective:  BP 118/88   Pulse 87   Temp 98.8 F (37.1 C) (Temporal)   Resp 18   SpO2 100%  There is no height or weight on file to calculate BMI. Physical Exam: General Appearance:  Alert, cooperative, no distress, appears stated age  Head:  Normocephalic, without obvious abnormality, atraumatic  Eyes:  Conjunctiva clear, EOM's intact  Nose: Nares normal, hypertrophic turbinates, normal mucosa, and no visible anterior polyps  Throat: Lips, tongue normal; teeth and gums normal, normal posterior oropharynx  Neck: Supple, symmetrical  Lungs:   clear to auscultation bilaterally, Respirations unlabored, no coughing and intermittent dry coughing  Heart:  regular rate and rhythm and no murmur, Appears well perfused  Extremities: No edema  Skin: Skin color, texture, turgor normal, no rashes or lesions on visualized portions of skin  Neurologic: No gross deficits   Spirometry:  Tracings reviewed. Her effort: Good reproducible efforts. FVC: 2.96L FEV1: 2.47L, 106% predicted FEV1/FVC ratio: 105% Interpretation:  Spirometry consistent with normal pattern.  Please see scanned spirometry results for details.   Assessment/Plan   Asthma-controlled Start Trelegy 200, 1 puff daily (preferred by insurance) once you run out of Symbicort 160, 2 puffs BID + Spiriva 2 puffs daily.  Continue montelukast 10 mg once a day to help prevent cough and wheeze Continue Dupixent injections once every 14 days and have access to an epinephrine auto-injector set May use Ventolin 2 puffs every 4 hours  as needed for cough, wheeze, tightness in chest, or shortness of breath For asthma flare, begin Flovent 110-2 puffs twice a day (in addition to your Trelegy) with a spacer for 2 weeks or until cough and wheeze free.   Seasonal and perennial allergic rhinitis-controlled Continue allergy injections per protocol. Continue to carry your epinephrine autoinjector. Continue Allegra 180 mg once a day as needed for runny nose Continue Nasacort 1 spray each nostril twice a day as needed for stuffy nose. Prescription sent Continue Mucinex [phone] mg twice a day and increase fluid intake to thin out mucus Continue saline nasal rinses one to two times a day for drainage. Use this before any medicated nasal sprays   Reflux-uncontrolled Continue dietary and lifestyle modifications as listed below Continue current regimen as per GI Continue to follow up with GI specialist for further evaluation and treatment  Atopic dermatitis/rash-stable Continue a daily moisturizing routine May use triamcinolone 0.1% ointment to red and itchy areas below your face twice a day as needed Recommend following up with dermatology concerning the rash on your back  Call the clinic if this treatment plan is not working well for you  Follow up in 6 months or sooner if needed. It was a pleasure meeting you today!  Tonny Bollman, MD  [facility]

## 2022-02-26 ENCOUNTER — Encounter: Payer: Self-pay | Admitting: Internal Medicine

## 2022-02-26 ENCOUNTER — Ambulatory Visit: Payer: BC Managed Care – PPO | Admitting: Internal Medicine

## 2022-02-26 ENCOUNTER — Other Ambulatory Visit: Payer: Self-pay | Admitting: *Deleted

## 2022-02-26 ENCOUNTER — Ambulatory Visit: Payer: Self-pay

## 2022-02-26 VITALS — BP 118/88 | HR 87 | Temp 98.8°F | Resp 18

## 2022-02-26 DIAGNOSIS — J4540 Moderate persistent asthma, uncomplicated: Secondary | ICD-10-CM | POA: Diagnosis not present

## 2022-02-26 DIAGNOSIS — J309 Allergic rhinitis, unspecified: Secondary | ICD-10-CM

## 2022-02-26 DIAGNOSIS — K219 Gastro-esophageal reflux disease without esophagitis: Secondary | ICD-10-CM

## 2022-02-26 DIAGNOSIS — J302 Other seasonal allergic rhinitis: Secondary | ICD-10-CM

## 2022-02-26 MED ORDER — DUPIXENT 300 MG/2ML ~~LOC~~ SOSY: PREFILLED_SYRINGE | SUBCUTANEOUS | 11 refills | Status: DC

## 2022-02-26 MED ORDER — ALBUTEROL SULFATE HFA 108 (90 BASE) MCG/ACT IN AERS: 2.0000 | INHALATION_SPRAY | RESPIRATORY_TRACT | 0 refills | Status: DC | PRN

## 2022-02-26 MED ORDER — TRELEGY ELLIPTA 200-62.5-25 MCG/ACT IN AEPB
1.0000 | INHALATION_SPRAY | Freq: Every day | RESPIRATORY_TRACT | 5 refills | Status: DC
Start: 2022-02-26 — End: 2022-08-23

## 2022-02-26 NOTE — Patient Instructions (Addendum)
Asthma Start Trelegy 200, 1 puff daily (preferred by insurance) once you run out of Symbicort 160, 2 puffs BID + Spiriva 2 puffs daily.  Continue montelukast 10 mg once a day to help prevent cough and wheeze Continue Dupixent injections once every 14 days and have access to an epinephrine auto-injector set May use Ventolin 2 puffs every 4 hours as needed for cough, wheeze, tightness in chest, or shortness of breath For asthma flare, begin Flovent 110-2 puffs twice a day (in addition to your Trelegy) with a spacer for 2 weeks or until cough and wheeze free.   Seasonal and perennial allergic rhinitis Continue allergy injections per protocol. Continue to carry your epinephrine autoinjector. Continue Allegra 180 mg once a day as needed for runny nose Continue Nasacort 1 spray each nostril twice a day as needed for stuffy nose. Prescription sent Continue Mucinex [phone] mg twice a day and increase fluid intake to thin out mucus Continue saline nasal rinses one to two times a day for drainage. Use this before any medicated nasal sprays   Reflux Continue dietary and lifestyle modifications as listed below Continue current regimen as per GI Continue to follow up with GI specialist for further evaluation and treatment  Atopic dermatitis/rash Continue a daily moisturizing routine May use triamcinolone 0.1% ointment to red and itchy areas below your face twice a day as needed Recommend following up with dermatology concerning the rash on your back  Call the clinic if this treatment plan is not working well for you  Follow up in 6 months or sooner if needed. It was a pleasure meeting you today!  Tonny Bollman, MD [facility]

## 2022-02-28 ENCOUNTER — Telehealth: Payer: Self-pay | Admitting: Family Medicine

## 2022-02-28 NOTE — Telephone Encounter (Signed)
Patient called stating Pharmacy has charged her from Meds that was supposed to be discontinued from her med list but I do see these meds was discontinued  (Symbicort and Atrovent) patient asking for a nurse to reach out to pharmacy to confirm these meds was discontinued so she will not be charged please advise

## 2022-03-01 NOTE — Telephone Encounter (Signed)
Pt called back and just needed [place] to call [facility] and tell them she has discontinued symbicort and spiriva I called [facility] and talked to atie and informed her of pt not using the symicort and spiriva

## 2022-03-01 NOTE — Telephone Encounter (Signed)
Lm for pt to call us back about this °

## 2022-03-06 ENCOUNTER — Ambulatory Visit (INDEPENDENT_AMBULATORY_CARE_PROVIDER_SITE_OTHER): Payer: BC Managed Care – PPO

## 2022-03-06 DIAGNOSIS — J309 Allergic rhinitis, unspecified: Secondary | ICD-10-CM

## 2022-03-07 NOTE — Telephone Encounter (Signed)
Spoke with Dave and informed her of [name]'s response to her Trelegy's question. Ambre then felt better then about taking this medication.

## 2022-03-07 NOTE — Telephone Encounter (Signed)
Trelegy is a tripple therapy inhaler that is FDA approved for asthma. It has mainly been marketed to COPD. Trelegy contains roughly the medications found in Symbicort and Spiriva in one inhaler

## 2022-03-07 NOTE — Telephone Encounter (Signed)
Patient had concern when she received the trelegy bc the insert stated it is for copd patients and not for asthma patients. Please advise

## 2022-03-13 ENCOUNTER — Ambulatory Visit (INDEPENDENT_AMBULATORY_CARE_PROVIDER_SITE_OTHER): Payer: BC Managed Care – PPO

## 2022-03-13 DIAGNOSIS — J309 Allergic rhinitis, unspecified: Secondary | ICD-10-CM

## 2022-03-20 ENCOUNTER — Ambulatory Visit (INDEPENDENT_AMBULATORY_CARE_PROVIDER_SITE_OTHER): Payer: BC Managed Care – PPO

## 2022-03-20 DIAGNOSIS — J309 Allergic rhinitis, unspecified: Secondary | ICD-10-CM

## 2022-04-03 ENCOUNTER — Ambulatory Visit (INDEPENDENT_AMBULATORY_CARE_PROVIDER_SITE_OTHER): Payer: BC Managed Care – PPO

## 2022-04-03 DIAGNOSIS — J309 Allergic rhinitis, unspecified: Secondary | ICD-10-CM

## 2022-04-10 ENCOUNTER — Ambulatory Visit (INDEPENDENT_AMBULATORY_CARE_PROVIDER_SITE_OTHER): Payer: BC Managed Care – PPO

## 2022-04-10 DIAGNOSIS — J309 Allergic rhinitis, unspecified: Secondary | ICD-10-CM

## 2022-04-17 ENCOUNTER — Ambulatory Visit (INDEPENDENT_AMBULATORY_CARE_PROVIDER_SITE_OTHER): Payer: BC Managed Care – PPO

## 2022-04-17 DIAGNOSIS — J309 Allergic rhinitis, unspecified: Secondary | ICD-10-CM | POA: Diagnosis not present

## 2022-04-23 ENCOUNTER — Ambulatory Visit (INDEPENDENT_AMBULATORY_CARE_PROVIDER_SITE_OTHER): Payer: BC Managed Care – PPO

## 2022-04-23 DIAGNOSIS — J309 Allergic rhinitis, unspecified: Secondary | ICD-10-CM | POA: Diagnosis not present

## 2022-05-08 ENCOUNTER — Ambulatory Visit (INDEPENDENT_AMBULATORY_CARE_PROVIDER_SITE_OTHER): Payer: BC Managed Care – PPO

## 2022-05-08 DIAGNOSIS — J309 Allergic rhinitis, unspecified: Secondary | ICD-10-CM | POA: Diagnosis not present

## 2022-05-14 ENCOUNTER — Other Ambulatory Visit: Payer: Self-pay

## 2022-05-14 MED ORDER — MONTELUKAST SODIUM 10 MG PO TABS: 10.0000 mg | ORAL_TABLET | Freq: Every day | ORAL | 1 refills | Status: DC

## 2022-05-15 ENCOUNTER — Ambulatory Visit (INDEPENDENT_AMBULATORY_CARE_PROVIDER_SITE_OTHER): Payer: BC Managed Care – PPO

## 2022-05-15 DIAGNOSIS — J309 Allergic rhinitis, unspecified: Secondary | ICD-10-CM

## 2022-05-22 ENCOUNTER — Ambulatory Visit (INDEPENDENT_AMBULATORY_CARE_PROVIDER_SITE_OTHER): Payer: BC Managed Care – PPO

## 2022-05-22 DIAGNOSIS — J309 Allergic rhinitis, unspecified: Secondary | ICD-10-CM | POA: Diagnosis not present

## 2022-05-29 ENCOUNTER — Ambulatory Visit (INDEPENDENT_AMBULATORY_CARE_PROVIDER_SITE_OTHER): Payer: BC Managed Care – PPO

## 2022-05-29 DIAGNOSIS — J309 Allergic rhinitis, unspecified: Secondary | ICD-10-CM | POA: Diagnosis not present

## 2022-06-05 ENCOUNTER — Ambulatory Visit (INDEPENDENT_AMBULATORY_CARE_PROVIDER_SITE_OTHER): Payer: BC Managed Care – PPO

## 2022-06-05 DIAGNOSIS — J309 Allergic rhinitis, unspecified: Secondary | ICD-10-CM

## 2022-06-12 ENCOUNTER — Ambulatory Visit (INDEPENDENT_AMBULATORY_CARE_PROVIDER_SITE_OTHER): Payer: BC Managed Care – PPO

## 2022-06-12 DIAGNOSIS — J309 Allergic rhinitis, unspecified: Secondary | ICD-10-CM

## 2022-06-17 ENCOUNTER — Other Ambulatory Visit: Payer: Self-pay | Admitting: Family Medicine

## 2022-06-18 ENCOUNTER — Ambulatory Visit (INDEPENDENT_AMBULATORY_CARE_PROVIDER_SITE_OTHER): Payer: BC Managed Care – PPO | Admitting: *Deleted

## 2022-06-18 DIAGNOSIS — J309 Allergic rhinitis, unspecified: Secondary | ICD-10-CM | POA: Diagnosis not present

## 2022-06-18 NOTE — Telephone Encounter (Signed)
OK to refill. Thank you.

## 2022-06-26 ENCOUNTER — Ambulatory Visit (INDEPENDENT_AMBULATORY_CARE_PROVIDER_SITE_OTHER): Payer: BC Managed Care – PPO

## 2022-06-26 DIAGNOSIS — J309 Allergic rhinitis, unspecified: Secondary | ICD-10-CM

## 2022-07-03 ENCOUNTER — Ambulatory Visit (INDEPENDENT_AMBULATORY_CARE_PROVIDER_SITE_OTHER): Payer: BC Managed Care – PPO

## 2022-07-03 DIAGNOSIS — J309 Allergic rhinitis, unspecified: Secondary | ICD-10-CM | POA: Diagnosis not present

## 2022-07-11 ENCOUNTER — Ambulatory Visit (INDEPENDENT_AMBULATORY_CARE_PROVIDER_SITE_OTHER): Payer: BC Managed Care – PPO

## 2022-07-11 DIAGNOSIS — J309 Allergic rhinitis, unspecified: Secondary | ICD-10-CM

## 2022-07-25 ENCOUNTER — Ambulatory Visit (INDEPENDENT_AMBULATORY_CARE_PROVIDER_SITE_OTHER): Payer: BC Managed Care – PPO

## 2022-07-25 DIAGNOSIS — J309 Allergic rhinitis, unspecified: Secondary | ICD-10-CM | POA: Diagnosis not present

## 2022-07-31 ENCOUNTER — Ambulatory Visit (INDEPENDENT_AMBULATORY_CARE_PROVIDER_SITE_OTHER): Payer: BC Managed Care – PPO

## 2022-07-31 DIAGNOSIS — J309 Allergic rhinitis, unspecified: Secondary | ICD-10-CM | POA: Diagnosis not present

## 2022-08-07 ENCOUNTER — Ambulatory Visit (INDEPENDENT_AMBULATORY_CARE_PROVIDER_SITE_OTHER): Payer: BC Managed Care – PPO

## 2022-08-07 DIAGNOSIS — J309 Allergic rhinitis, unspecified: Secondary | ICD-10-CM

## 2022-08-13 ENCOUNTER — Ambulatory Visit (INDEPENDENT_AMBULATORY_CARE_PROVIDER_SITE_OTHER): Payer: BC Managed Care – PPO

## 2022-08-13 DIAGNOSIS — J309 Allergic rhinitis, unspecified: Secondary | ICD-10-CM | POA: Diagnosis not present

## 2022-08-20 ENCOUNTER — Ambulatory Visit (INDEPENDENT_AMBULATORY_CARE_PROVIDER_SITE_OTHER): Payer: BC Managed Care – PPO

## 2022-08-20 DIAGNOSIS — J309 Allergic rhinitis, unspecified: Secondary | ICD-10-CM

## 2022-08-20 NOTE — Progress Notes (Signed)
Opened in error

## 2022-08-22 NOTE — Progress Notes (Unsigned)
FOLLOW UP Date of Service/Encounter:  08/23/22   Subjective:  Bridget Bell (DOB: [birth year]) is a 62 y.o. female who [facility] to [facility] on 08/23/2022 in re-evaluation of the following: moderate persistent asthma, allergic rhinitis, GERD, atopic dermatitis  History obtained from: chart review and patient.  For Review, LV was on 02/26/22  with Dr.Torah Pinnock seen for routine follow-up. Overall doing well except for reflux managed by GI. FEV1 106%. We switched to Trelegy 200 due to insurance (was controlled on symbicort 160 + spiriva).  Pertinent history/diagnostics:  She has tried and failed Berna Bue, Xolair in the past. January 2018 she was started on Xolair.  In May 2018 she was started on Fasenra.  She started Dupixent in Sept 2020 and controlled, on home-dosing. On AIT-had reached maintenance at continued on this at least from 2016-2019.  She did restart after taking a break. Atrovent nasal spray dries her too much. Immune work-up in 2018: adequate strep titers following vaccine, adequate dipertheria and tetanus vaccine titers. Adeuqate CH50, normal quant immunoglobulins, IgE 522. Has uncontrolled reflux; bariatric surgery offered but declined. Taking aciphex BID. Follows with GI. Has hiatal hernia.  Today presents for follow-up. She continues in build-up. Now in red vial. Last dose was 08/20/22 and she received 0.05 mL of red vial.  She did have a URI a few weeks ago, but symptoms now resolved. She does have chest congestion in the morning and on/off stuffy nose. This is typical for her. She continues to do dupixent daily. She is using mucinex and allegra as an expectorant. Having occasional nose bleeds. Using nasonex.   She stopped using nasal saline rinses because it can cause intense pressure in her ears and a headaches if she is clogged. She is using Systane eye drops for dry eyes She does have biotene that she uses for dry mouth. Dupixent is going great for her. She  feels it kept her well during the winter. She continues home dosing. She is using Trelegy 1  puff daily. Has not needed Flovent.  She continues to have significant acid reflux but feels improvement while on sucralfate.   Allergies as of 08/23/2022   No Known Allergies      Medication List        Accurate as of August 23, 2022  4:56 PM. If you have any questions, ask your nurse or doctor.          STOP taking these medications    ECHINACEA PO   famotidine 40 MG tablet Commonly known as: PEPCID   losartan-hydrochlorothiazide 100-25 MG tablet Commonly known as: HYZAAR   magnesium gluconate 54mg /42ml syringe   meloxicam 15 MG tablet Commonly known as: MOBIC   triamcinolone acetonide 40 MG/ML Susp Commonly known as: TRIESENCE       TAKE these medications    acetaminophen 650 MG CR tablet Commonly known as: TYLENOL Take 1,300 mg by mouth daily.   albuterol 108 (90 Base) MCG/ACT inhaler Commonly known as: Ventolin HFA Inhale 2 puffs into the lungs every 4 (four) hours as needed for wheezing or shortness of breath.   albuterol (2.5 MG/3ML) 0.083% nebulizer solution Commonly known as: PROVENTIL USE ONE VIAL IN NEBULIZER EVERY 4 TO 6 HOURS AS NEEDED FOR COUGH OR WHEEZE   amLODipine 5 MG tablet Commonly known as: NORVASC Take 5 mg by mouth daily.   Biotin 10 MG Caps Take by mouth.   bupivacaine 0.25 % injection Commonly known as: MARCAINE Inject into the skin.  Calcium Carb-Cholecalciferol 600-800 MG-UNIT Tabs Take by mouth daily.   Cholecalciferol 125 MCG (5000 UT) Tabs Take by mouth.   CINNAMON PLUS CHROMIUM PO Take 1,000 mg by mouth 2 (two) times daily.   cyclobenzaprine 10 MG tablet Commonly known as: FLEXERIL Take 1 tablet by mouth in the morning and at bedtime.   Dupixent 300 MG/2ML prefilled syringe Generic drug: dupilumab INJECT 300 MG (1 SYRINGE) UNDER THE SKIN EVERY 2 WEEKS   EPINEPHrine 0.3 mg/0.3 mL Soaj injection Commonly known as:  EPI-PEN Inject into the muscle. What changed: Another medication with the same name was removed. Continue taking this medication, and follow the directions you see here.   ESTER C PO Take 1,000 mg by mouth daily.   estradiol 0.5 MG tablet Commonly known as: ESTRACE Take 0.5 mg by mouth.   ferrous sulfate 325 (65 FE) MG tablet Take 325 mg by mouth.   fexofenadine 180 MG tablet Commonly known as: ALLEGRA Take 180 mg by mouth daily.   MUCINEX ALLERGY PO Take 1,600 mg by mouth daily. Sometimes patient takes it 3 times daily.   Flovent HFA 110 MCG/ACT inhaler Generic drug: fluticasone INHALE 2 PUFFS BY MOUTH TWO TIMES DAILY DURING ASTHMA FLARES   folic acid 086 MCG tablet Commonly known as: FOLVITE Take by mouth.   Ginger Root 550 MG Caps Take 550 mg by mouth daily.   GLUCOSAMINE CHONDROITIN MSM PO Take 1,500 mg by mouth daily.   hydrALAZINE 10 MG tablet Commonly known as: APRESOLINE Take 10 mg by mouth 3 (three) times daily.   hydrochlorothiazide 25 MG tablet Commonly known as: HYDRODIURIL Take 50 mg by mouth daily.   KOREAN GINSENG PO Take 500 mg by mouth daily. Reported on 09/02/2015   losartan 100 MG tablet Commonly known as: COZAAR Take 100 mg by mouth daily.   Magnesium 400 MG Caps Take by mouth daily.   MAXIMUM RED KRILL PO Take 1,000 mg by mouth daily.   montelukast 10 MG tablet Commonly known as: SINGULAIR Take 1 tablet (10 mg total) by mouth at bedtime.   MULTIVITAMIN WOMEN 50+ PO Take by mouth.   NONFORMULARY OR COMPOUNDED ITEM ALLERGY INJECTIONS WEEKLY   optichamber diamond Misc USE AS DIRECTED.   PAPAYA ENZYME PO Take 66 mg by mouth daily.   potassium chloride 10 MEQ tablet Commonly known as: KLOR-CON M TAKE ONE TABLET BY MOUTH EVERY DAY   RABEprazole 20 MG tablet Commonly known as: ACIPHEX Take 1 tablet by mouth 2 (two) times daily.   selenium 200 MCG Tabs tablet Take by mouth.   sucralfate 1 g tablet Commonly known as:  CARAFATE Take 1 tablet by mouth 4 (four) times daily as needed.   Trelegy Ellipta 200-62.5-25 MCG/ACT Aepb Generic drug: Fluticasone-Umeclidin-Vilant Inhale 1 puff into the lungs daily.   triamcinolone 55 MCG/ACT Aero nasal inhaler Commonly known as: NASACORT SPRAY 1 SPRAY INTO EACH NOSTRIL TWO TIMES DAILY IF NEEDED FOR STUFFY NOSE   triamcinolone ointment 0.1 % Commonly known as: KENALOG Use 1 application twice a day as needed to red itchy areas.  Do not use on face, neck, groin, or armpit region   vitamin E 1000 UNIT capsule Take by mouth.       Past Medical History:  Diagnosis Date   Arthritis    Asthma    Hiatal hernia 05/2020   Hypertension    No past surgical history on file. Otherwise, there have been no changes to her past medical history, surgical history, family  history, or social history.  ROS: All others negative except as noted per HPI.   Objective:  BP 112/74   Pulse 90   Temp 98.7 F (37.1 C) (Temporal)   Resp 20   SpO2 98%  There is no height or weight on file to calculate BMI. Physical Exam: General Appearance:  Alert, cooperative, no distress, appears stated age  Head:  Normocephalic, without obvious abnormality, atraumatic  Eyes:  Conjunctiva clear, EOM's intact  Nose: Nares normal,  erythematous and nonhypertrophic mucosa and no visible anterior polyps  Throat: Lips, tongue normal; teeth and gums normal,  dry mucosa  Neck: Supple, symmetrical  Lungs:   clear to auscultation bilaterally, Respirations unlabored, intermittent dry coughing  Heart:  regular rate and rhythm and no murmur, Appears well perfused  Extremities: No edema  Skin: Skin color, texture, turgor normal, no rashes or lesions on visualized portions of skin  Neurologic: No gross deficits   Spirometry:  Tracings reviewed. Her effort: Good reproducible efforts. FVC: 2.94L FEV1: 2.4L, 104% predicted FEV1/FVC ratio: 104% Interpretation: Spirometry consistent with normal pattern.   Please see scanned spirometry results for details.  Assessment/Plan   Asthma-controlled Trelegy 200, 1 puff daily Continue montelukast 10 mg once a day to help prevent cough and wheeze Continue Dupixent injections once every 14 days and have access to an epinephrine auto-injector set May use Ventolin 2 puffs every 4 hours as needed for cough, wheeze, tightness in chest, or shortness of breath For asthma flare, begin Flovent 110-2 puffs twice a day (in addition to your Trelegy) with a spacer for 2 weeks or until cough and wheeze free.   Seasonal and perennial allergic rhinitis with upper airway cough syndrome-chronic, stable Continue allergy injections per protocol. Continue to carry your epinephrine autoinjector. Continue Allegra 180 mg once a day as needed for runny nose Stop Nasacort - this can cause nosebleeds Start to decrease the amount of Mucinex: This is making you dry! - start removing one tablet every few days. Continue saline spray one to two times a day for drainage. Use this before any medicated nasal sprays  Continue biotene mouth wash for dry mouth  Reflux-chronic, stable Continue dietary and lifestyle modifications as listed below Continue current regimen as per GI Continue to follow up with GI specialist for further evaluation and treatment  Atopic dermatitis/rash-controlled Continue a daily moisturizing routine May use triamcinolone 0.1% ointment to red and itchy areas below your face twice a day as needed Recommend following up with dermatology concerning the rash on your back  Call the clinic if this treatment plan is not working well for you  Follow up in 6 months or sooner if needed. It was a pleasure seeing you again.  Sigurd Sos, MD  [facility]

## 2022-08-23 ENCOUNTER — Encounter: Payer: Self-pay | Admitting: Internal Medicine

## 2022-08-23 ENCOUNTER — Ambulatory Visit: Payer: BC Managed Care – PPO | Admitting: Internal Medicine

## 2022-08-23 VITALS — BP 112/74 | HR 90 | Temp 98.7°F | Resp 20

## 2022-08-23 DIAGNOSIS — J4540 Moderate persistent asthma, uncomplicated: Secondary | ICD-10-CM

## 2022-08-23 DIAGNOSIS — R058 Other specified cough: Secondary | ICD-10-CM

## 2022-08-23 DIAGNOSIS — L308 Other specified dermatitis: Secondary | ICD-10-CM

## 2022-08-23 DIAGNOSIS — J302 Other seasonal allergic rhinitis: Secondary | ICD-10-CM

## 2022-08-23 DIAGNOSIS — R0602 Shortness of breath: Secondary | ICD-10-CM

## 2022-08-23 DIAGNOSIS — J3089 Other allergic rhinitis: Secondary | ICD-10-CM

## 2022-08-23 DIAGNOSIS — K219 Gastro-esophageal reflux disease without esophagitis: Secondary | ICD-10-CM | POA: Diagnosis not present

## 2022-08-23 DIAGNOSIS — L309 Dermatitis, unspecified: Secondary | ICD-10-CM | POA: Insufficient documentation

## 2022-08-23 MED ORDER — MONTELUKAST SODIUM 10 MG PO TABS: 10.0000 mg | ORAL_TABLET | Freq: Every day | ORAL | 1 refills | Status: DC

## 2022-08-23 MED ORDER — FLUTICASONE PROPIONATE HFA 110 MCG/ACT IN AERO: INHALATION_SPRAY | RESPIRATORY_TRACT | 1 refills | Status: DC

## 2022-08-23 MED ORDER — TRELEGY ELLIPTA 200-62.5-25 MCG/ACT IN AEPB: 1.0000 | INHALATION_SPRAY | Freq: Every day | RESPIRATORY_TRACT | 5 refills | Status: DC

## 2022-08-23 MED ORDER — EPINEPHRINE 0.3 MG/0.3ML IJ SOAJ: 0.3000 mg | INTRAMUSCULAR | 2 refills | Status: DC | PRN

## 2022-08-23 NOTE — Patient Instructions (Addendum)
Asthma Trelegy 200, 1 puff daily Continue montelukast 10 mg once a day to help prevent cough and wheeze Continue Dupixent injections once every 14 days and have access to an epinephrine auto-injector set May use Ventolin 2 puffs every 4 hours as needed for cough, wheeze, tightness in chest, or shortness of breath For asthma flare, begin Flovent 110-2 puffs twice a day (in addition to your Trelegy) with a spacer for 2 weeks or until cough and wheeze free.   Seasonal and perennial allergic rhinitis with upper airway cough syndrome Continue allergy injections per protocol. Continue to carry your epinephrine autoinjector. Continue Allegra 180 mg once a day as needed for runny nose Stop Nasacort - this can cause nosebleeds Start to decrease the amount of Mucinex: This is making you dry! - start removing one tablet every few days. Continue saline spray one to two times a day for drainage. Use this before any medicated nasal sprays  Continue biotene mouth wash for dry mouth  Reflux Continue dietary and lifestyle modifications as listed below Continue current regimen as per GI Continue to follow up with GI specialist for further evaluation and treatment  Atopic dermatitis/rash Continue a daily moisturizing routine May use triamcinolone 0.1% ointment to red and itchy areas below your face twice a day as needed Recommend following up with dermatology concerning the rash on your back  Call the clinic if this treatment plan is not working well for you  Follow up in 6 months or sooner if needed. It was a pleasure seeing you again.  Sigurd Sos, MD [facility]

## 2022-08-24 ENCOUNTER — Other Ambulatory Visit: Payer: Self-pay

## 2022-08-24 MED ORDER — FLUTICASONE PROPIONATE HFA 110 MCG/ACT IN AERO: 2.0000 | INHALATION_SPRAY | Freq: Two times a day (BID) | RESPIRATORY_TRACT | 1 refills | Status: AC

## 2022-09-04 ENCOUNTER — Ambulatory Visit (INDEPENDENT_AMBULATORY_CARE_PROVIDER_SITE_OTHER): Payer: BC Managed Care – PPO

## 2022-09-04 DIAGNOSIS — J309 Allergic rhinitis, unspecified: Secondary | ICD-10-CM

## 2022-09-11 ENCOUNTER — Ambulatory Visit (INDEPENDENT_AMBULATORY_CARE_PROVIDER_SITE_OTHER): Payer: BC Managed Care – PPO

## 2022-09-11 DIAGNOSIS — J309 Allergic rhinitis, unspecified: Secondary | ICD-10-CM | POA: Diagnosis not present

## 2022-09-17 ENCOUNTER — Ambulatory Visit (INDEPENDENT_AMBULATORY_CARE_PROVIDER_SITE_OTHER): Payer: BC Managed Care – PPO

## 2022-09-17 DIAGNOSIS — J309 Allergic rhinitis, unspecified: Secondary | ICD-10-CM | POA: Diagnosis not present

## 2022-09-26 ENCOUNTER — Ambulatory Visit (INDEPENDENT_AMBULATORY_CARE_PROVIDER_SITE_OTHER): Payer: BC Managed Care – PPO

## 2022-09-26 DIAGNOSIS — J309 Allergic rhinitis, unspecified: Secondary | ICD-10-CM | POA: Diagnosis not present

## 2022-10-02 ENCOUNTER — Ambulatory Visit (INDEPENDENT_AMBULATORY_CARE_PROVIDER_SITE_OTHER): Payer: BC Managed Care – PPO

## 2022-10-02 DIAGNOSIS — J309 Allergic rhinitis, unspecified: Secondary | ICD-10-CM | POA: Diagnosis not present

## 2022-10-09 ENCOUNTER — Ambulatory Visit (INDEPENDENT_AMBULATORY_CARE_PROVIDER_SITE_OTHER): Payer: BC Managed Care – PPO

## 2022-10-09 DIAGNOSIS — J309 Allergic rhinitis, unspecified: Secondary | ICD-10-CM | POA: Diagnosis not present

## 2022-10-14 IMAGING — DX DG CHEST 2V
2 series · 2 of 2 positions shown · non-contrast
Comparison: 07/21/2020

CLINICAL DATA: Productive cough for 1 week, initial encounter

EXAM:
CHEST - 2 VIEW

[chest pa]
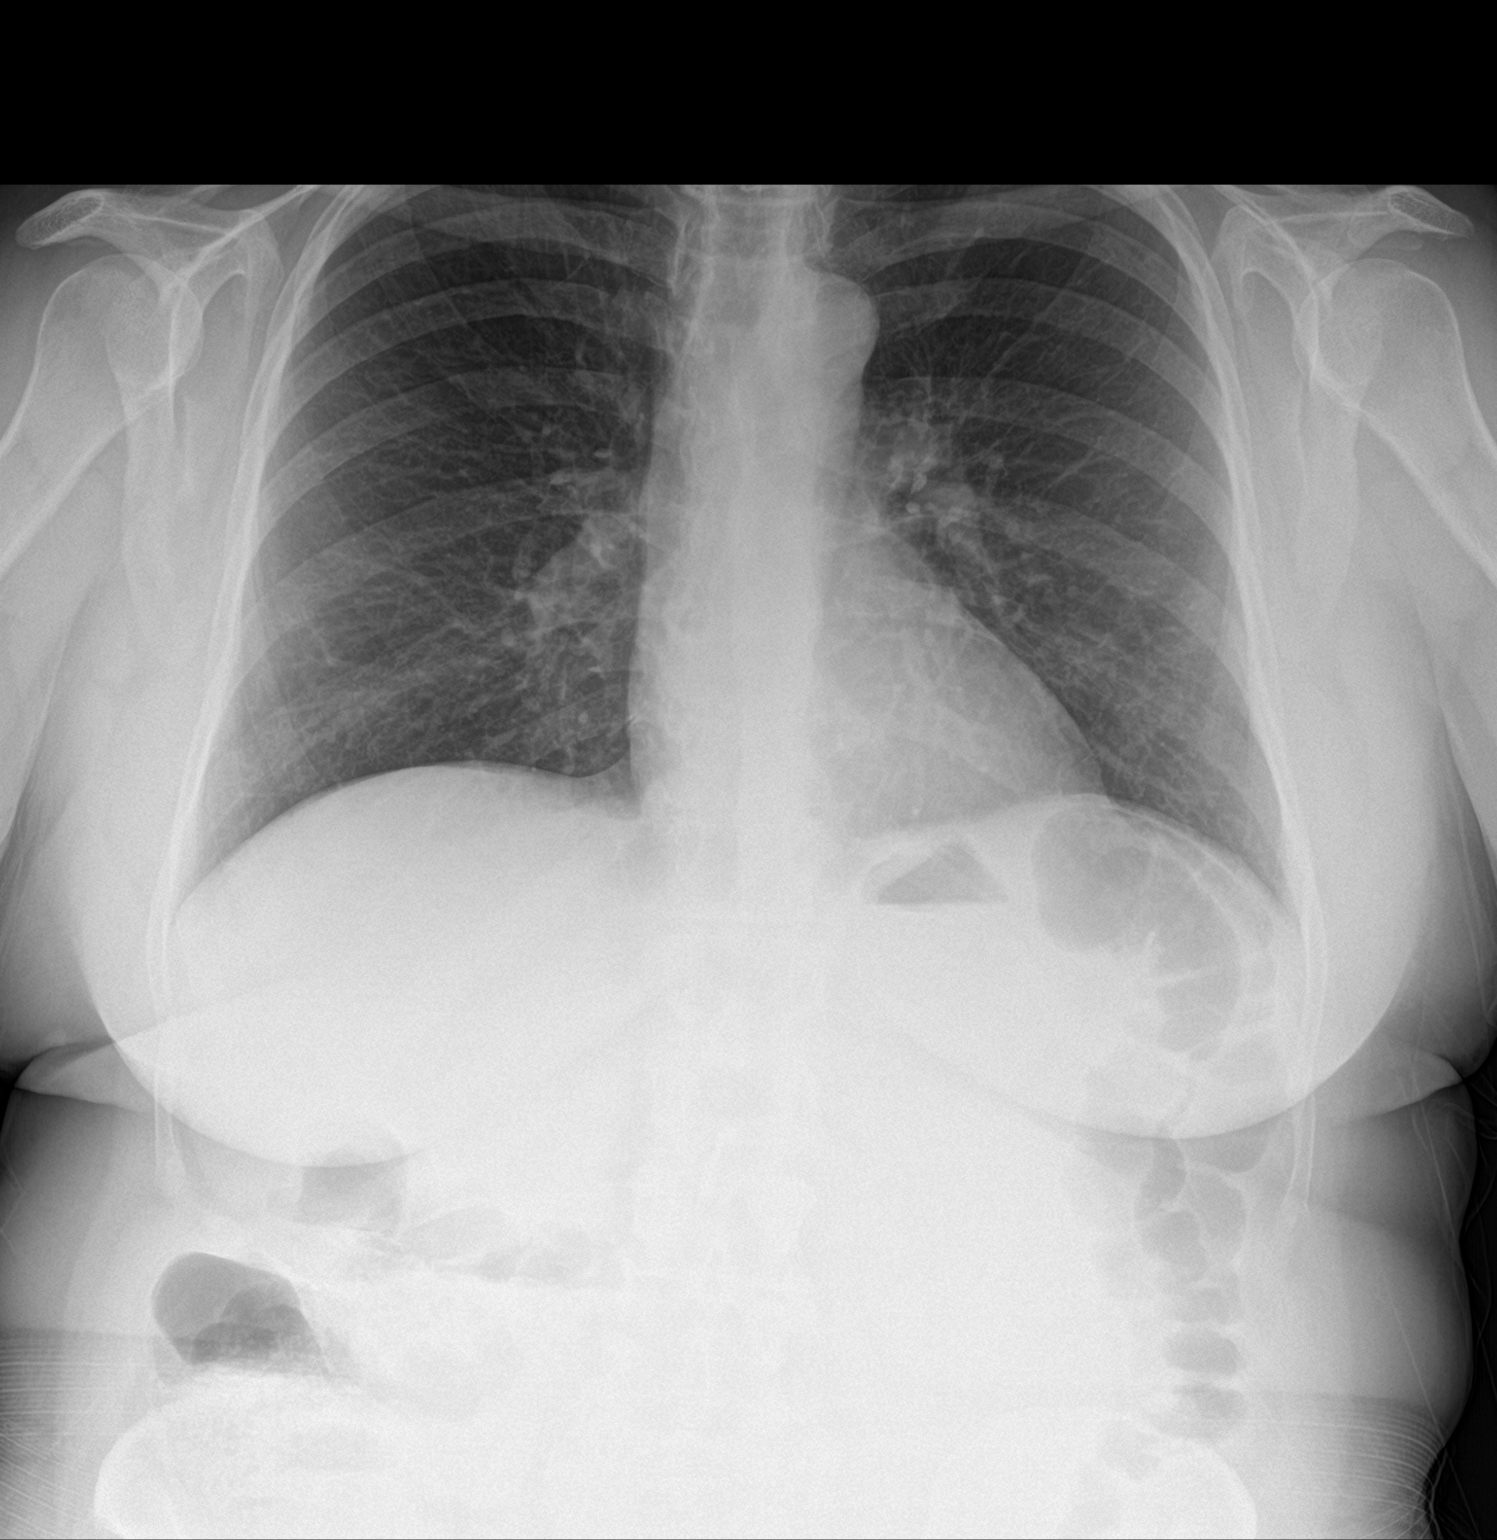

[chest lat]
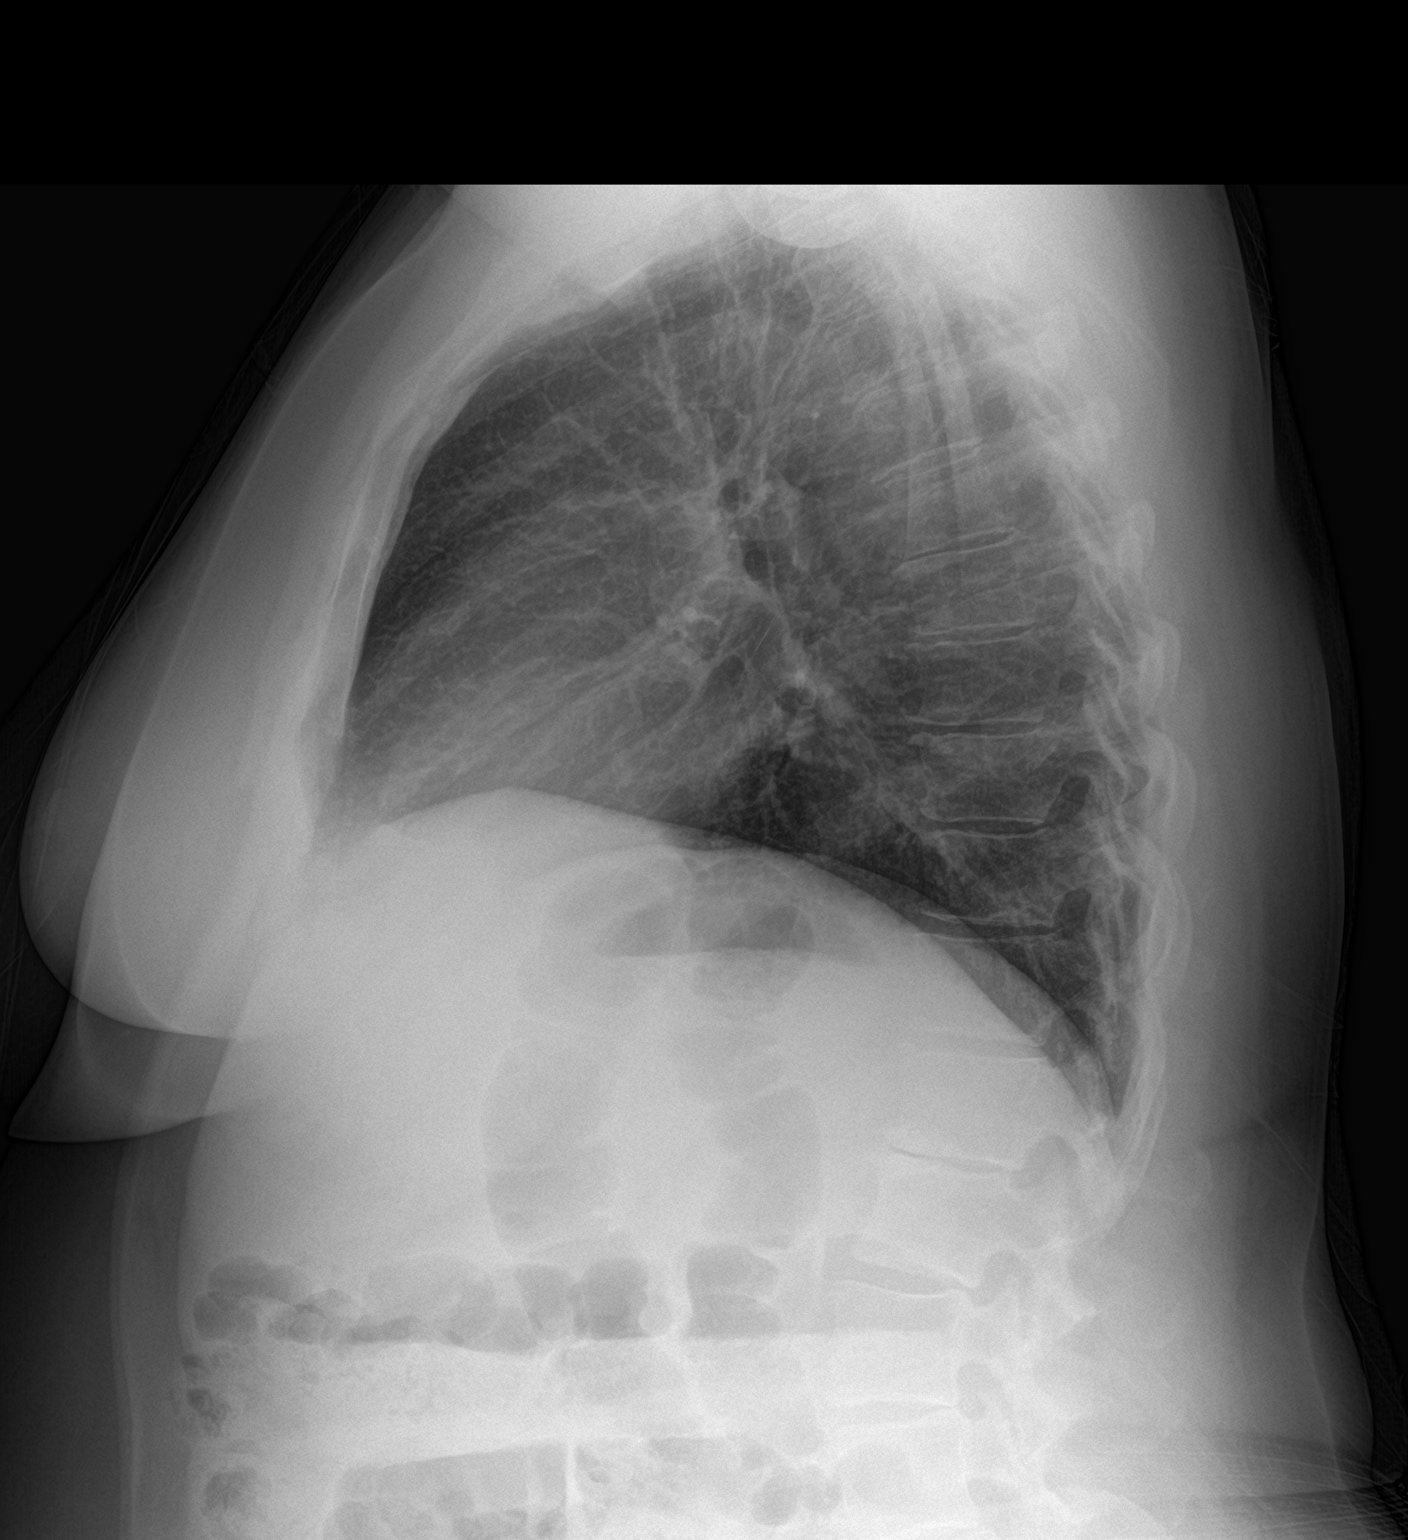

[2 of 2 positions shown; findings below may reference images not displayed]

FINDINGS: Cardiac shadow is within normal limits. The lungs are clear
bilaterally. No acute bony abnormality is seen.
IMPRESSION: No active cardiopulmonary disease.

## 2022-10-15 ENCOUNTER — Ambulatory Visit (INDEPENDENT_AMBULATORY_CARE_PROVIDER_SITE_OTHER): Payer: BC Managed Care – PPO

## 2022-10-15 DIAGNOSIS — J309 Allergic rhinitis, unspecified: Secondary | ICD-10-CM | POA: Diagnosis not present

## 2022-10-22 NOTE — Progress Notes (Signed)
EXP 10/22/23

## 2022-10-23 ENCOUNTER — Ambulatory Visit (INDEPENDENT_AMBULATORY_CARE_PROVIDER_SITE_OTHER): Payer: BC Managed Care – PPO

## 2022-10-23 DIAGNOSIS — J309 Allergic rhinitis, unspecified: Secondary | ICD-10-CM

## 2022-10-25 DIAGNOSIS — J3089 Other allergic rhinitis: Secondary | ICD-10-CM | POA: Diagnosis not present

## 2022-10-30 ENCOUNTER — Ambulatory Visit (INDEPENDENT_AMBULATORY_CARE_PROVIDER_SITE_OTHER): Payer: BC Managed Care – PPO

## 2022-10-30 DIAGNOSIS — J309 Allergic rhinitis, unspecified: Secondary | ICD-10-CM | POA: Diagnosis not present

## 2022-11-06 ENCOUNTER — Ambulatory Visit (INDEPENDENT_AMBULATORY_CARE_PROVIDER_SITE_OTHER): Payer: BC Managed Care – PPO

## 2022-11-06 DIAGNOSIS — J309 Allergic rhinitis, unspecified: Secondary | ICD-10-CM | POA: Diagnosis not present

## 2022-11-12 ENCOUNTER — Ambulatory Visit (INDEPENDENT_AMBULATORY_CARE_PROVIDER_SITE_OTHER): Payer: BC Managed Care – PPO

## 2022-11-12 DIAGNOSIS — J309 Allergic rhinitis, unspecified: Secondary | ICD-10-CM | POA: Diagnosis not present

## 2022-11-20 ENCOUNTER — Ambulatory Visit (INDEPENDENT_AMBULATORY_CARE_PROVIDER_SITE_OTHER): Payer: BC Managed Care – PPO

## 2022-11-20 DIAGNOSIS — J309 Allergic rhinitis, unspecified: Secondary | ICD-10-CM | POA: Diagnosis not present

## 2022-11-27 ENCOUNTER — Ambulatory Visit (INDEPENDENT_AMBULATORY_CARE_PROVIDER_SITE_OTHER): Payer: BC Managed Care – PPO

## 2022-11-27 DIAGNOSIS — J309 Allergic rhinitis, unspecified: Secondary | ICD-10-CM

## 2022-12-03 ENCOUNTER — Ambulatory Visit (INDEPENDENT_AMBULATORY_CARE_PROVIDER_SITE_OTHER): Payer: BC Managed Care – PPO

## 2022-12-03 DIAGNOSIS — J309 Allergic rhinitis, unspecified: Secondary | ICD-10-CM | POA: Diagnosis not present

## 2022-12-11 ENCOUNTER — Ambulatory Visit (INDEPENDENT_AMBULATORY_CARE_PROVIDER_SITE_OTHER): Payer: BC Managed Care – PPO

## 2022-12-11 DIAGNOSIS — J309 Allergic rhinitis, unspecified: Secondary | ICD-10-CM

## 2022-12-18 ENCOUNTER — Ambulatory Visit (INDEPENDENT_AMBULATORY_CARE_PROVIDER_SITE_OTHER): Payer: BC Managed Care – PPO

## 2022-12-18 DIAGNOSIS — J309 Allergic rhinitis, unspecified: Secondary | ICD-10-CM | POA: Diagnosis not present

## 2022-12-25 ENCOUNTER — Ambulatory Visit (INDEPENDENT_AMBULATORY_CARE_PROVIDER_SITE_OTHER): Payer: BC Managed Care – PPO

## 2022-12-25 DIAGNOSIS — J309 Allergic rhinitis, unspecified: Secondary | ICD-10-CM | POA: Diagnosis not present

## 2023-01-02 ENCOUNTER — Ambulatory Visit (INDEPENDENT_AMBULATORY_CARE_PROVIDER_SITE_OTHER): Payer: BC Managed Care – PPO

## 2023-01-02 DIAGNOSIS — J309 Allergic rhinitis, unspecified: Secondary | ICD-10-CM | POA: Diagnosis not present

## 2023-01-07 ENCOUNTER — Telehealth: Payer: Self-pay | Admitting: Internal Medicine

## 2023-01-07 MED ORDER — TRIAMCINOLONE ACETONIDE 0.1 % EX OINT: TOPICAL_OINTMENT | CUTANEOUS | 0 refills | Status: DC

## 2023-01-07 NOTE — Telephone Encounter (Signed)
[facility] request a refill for triamcinolone ointment, pharmacy states this is an urgent request.

## 2023-01-07 NOTE — Telephone Encounter (Signed)
Pt was just seen sent in refill of triamcinolone to [facility]

## 2023-01-08 ENCOUNTER — Other Ambulatory Visit: Payer: Self-pay

## 2023-01-11 ENCOUNTER — Telehealth: Payer: Self-pay | Admitting: Internal Medicine

## 2023-01-11 MED ORDER — FLUTICASONE PROPIONATE 50 MCG/ACT NA SUSP: NASAL | 0 refills | Status: DC

## 2023-01-11 NOTE — Telephone Encounter (Signed)
Sending in refill for nasacort to [facility] pharmacy. Patient needs office visit. Will notify the patient. Left patient a message that I sent in one nasacort and to call office to schedule 6 month office visit.

## 2023-01-11 NOTE — Telephone Encounter (Signed)
Bridget Bell has requested medication  Nasacort to be filled Pharmacy ([facility] call to see if this can be filled please advise [phone])

## 2023-01-14 ENCOUNTER — Telehealth: Payer: Self-pay

## 2023-01-14 MED ORDER — TRIAMCINOLONE ACETONIDE 55 MCG/ACT NA AERO: 2.0000 | INHALATION_SPRAY | Freq: Every day | NASAL | 5 refills | Status: AC

## 2023-01-14 NOTE — Addendum Note (Signed)
Addended by: Berna Bue on: 01/14/2023 03:56 PM   Modules accepted: Orders

## 2023-01-14 NOTE — Telephone Encounter (Signed)
We had stopped it due to nosebleeds. If she is not having nosebleeds, she can restart. It is over-the-counter, so may not be covered. We can try ryaltris or dymista if these are covered.

## 2023-01-14 NOTE — Telephone Encounter (Signed)
Please advise to send ing nasacort nasal spray as it is not listed on last avs

## 2023-01-14 NOTE — Telephone Encounter (Signed)
Sent in nasacort to [facility] it appears it might not be on pts formulary

## 2023-01-22 ENCOUNTER — Ambulatory Visit (INDEPENDENT_AMBULATORY_CARE_PROVIDER_SITE_OTHER): Payer: BC Managed Care – PPO

## 2023-01-22 DIAGNOSIS — J309 Allergic rhinitis, unspecified: Secondary | ICD-10-CM | POA: Diagnosis not present

## 2023-01-28 ENCOUNTER — Other Ambulatory Visit: Payer: Self-pay | Admitting: Internal Medicine

## 2023-01-29 ENCOUNTER — Ambulatory Visit (INDEPENDENT_AMBULATORY_CARE_PROVIDER_SITE_OTHER): Payer: BC Managed Care – PPO

## 2023-01-29 ENCOUNTER — Other Ambulatory Visit: Payer: Self-pay | Admitting: Allergy & Immunology

## 2023-01-29 DIAGNOSIS — J309 Allergic rhinitis, unspecified: Secondary | ICD-10-CM

## 2023-02-04 DIAGNOSIS — J3089 Other allergic rhinitis: Secondary | ICD-10-CM | POA: Diagnosis not present

## 2023-02-04 NOTE — Progress Notes (Signed)
VIAL EXP 02-04-24

## 2023-02-05 ENCOUNTER — Ambulatory Visit (INDEPENDENT_AMBULATORY_CARE_PROVIDER_SITE_OTHER): Payer: BC Managed Care – PPO

## 2023-02-05 DIAGNOSIS — J309 Allergic rhinitis, unspecified: Secondary | ICD-10-CM

## 2023-02-13 ENCOUNTER — Other Ambulatory Visit: Payer: Self-pay | Admitting: Internal Medicine

## 2023-02-15 ENCOUNTER — Other Ambulatory Visit: Payer: Self-pay | Admitting: Internal Medicine

## 2023-02-19 ENCOUNTER — Ambulatory Visit (INDEPENDENT_AMBULATORY_CARE_PROVIDER_SITE_OTHER): Payer: BC Managed Care – PPO

## 2023-02-19 DIAGNOSIS — J309 Allergic rhinitis, unspecified: Secondary | ICD-10-CM

## 2023-02-25 NOTE — Patient Instructions (Incomplete)
Asthma Trelegy 200, 1 puff daily. Rinse mouth out after Continue montelukast 10 mg once a day to help prevent cough and wheeze Continue Dupixent injections once every 14 days and have access to an epinephrine auto-injector set May use Ventolin 2 puffs every 4 hours as needed for cough, wheeze, tightness in chest, or shortness of breath For asthma flare, begin Flovent 110-2 puffs twice a day (in addition to your Trelegy) with a spacer for 2 weeks or until cough and wheeze free.   Asthma control goals:  Full participation in all desired activities (may need albuterol before activity) Albuterol use two time or less a week on average (not counting use with activity) Cough interfering with sleep two time or less a month Oral steroids no more than once a year No hospitalizations  Seasonal and perennial allergic rhinitis with upper airway cough syndrome Continue allergy injections per protocol. Continue to carry your epinephrine autoinjector. Continue Allegra 180 mg once a day as needed for runny nose May use  Nasacort  1 spray in each nostril once a day as needed for stuffy nose. It you start having nose bleeds stop using Nasacort Continue saline spray one to two times a day for drainage. Use this before any medicated nasal sprays  Continue biotene mouth wash for dry mouth  Reflux Continue dietary and lifestyle modifications as listed below Continue current regimen as per GI Continue to follow up with GI specialist for further evaluation and treatment  Atopic dermatitis/rash Continue a daily moisturizing routine May use triamcinolone 0.1% ointment to red and itchy areas below your face twice a day as needed   Call the clinic if this treatment plan is not working well for you  Follow up in 6 months or sooner if needed.

## 2023-02-26 ENCOUNTER — Encounter: Payer: Self-pay | Admitting: Family

## 2023-02-26 ENCOUNTER — Ambulatory Visit: Payer: BC Managed Care – PPO | Admitting: Family Medicine

## 2023-02-26 ENCOUNTER — Ambulatory Visit: Payer: BC Managed Care – PPO | Admitting: Family

## 2023-02-26 VITALS — BP 112/72 | HR 96 | Temp 97.5°F | Resp 18

## 2023-02-26 DIAGNOSIS — J302 Other seasonal allergic rhinitis: Secondary | ICD-10-CM

## 2023-02-26 DIAGNOSIS — J3089 Other allergic rhinitis: Secondary | ICD-10-CM

## 2023-02-26 DIAGNOSIS — R21 Rash and other nonspecific skin eruption: Secondary | ICD-10-CM

## 2023-02-26 DIAGNOSIS — R058 Other specified cough: Secondary | ICD-10-CM

## 2023-02-26 DIAGNOSIS — K219 Gastro-esophageal reflux disease without esophagitis: Secondary | ICD-10-CM | POA: Diagnosis not present

## 2023-02-26 DIAGNOSIS — J4540 Moderate persistent asthma, uncomplicated: Secondary | ICD-10-CM

## 2023-02-26 NOTE — Progress Notes (Signed)
[address] [place] [address] Dept: [phone]  FOLLOW UP NOTE  Patient ID: Bridget Bell, female    DOB: 1960-10-23  Age: 62 y.o. MRN: [id number] Date of Office Visit: 02/26/2023  Assessment  Chief Complaint: Asthma  HPI Bridget Bell is a 62 year old female who presents today for follow-up of asthma, seasonal and perennial allergic rhinitis with upper airway cough syndrome, reflux,  and atopic dermatitis/rash.  She was last seen on August 23, 2022 by Dr. Maurine Minister.  She denies any new diagnosis or surgery since her last office visit.  She reports that she is having cataract surgery on her right eye tomorrow and she will have her left eye done 2 weeks later.  Asthma: She continues to take Trelegy 1 puff once a day, montelukast 10 mg once a day, and Dupixent injections every 14 days, albuterol as needed, and Flovent 110 mcg for asthma flares.  She reports that she has not had to use her Flovent 110 for any asthma flare since her last office visit.  She reports continued coughing.  The cough is not constant though.  Her sputum is clear in color.  The cough was better 2 to 3 weeks ago then it came back.  She reports that it comes and goes like that.  She reports a little bit of wheezing at times and denies tightness in chest, shortness of breath, fever, and chills.  The cough sometimes does wake her up at night.  Since her last office visit she has not required any trips to the emergency room or urgent care due to breathing problems.  She has also not received any steroids due to breathing problems.  She reports that she is currently on steroid eyedrops for her right eye due to her upcoming cataract surgery.  She reports very rare use of her albuterol inhaler.  She reports that she only uses albuterol when she is sick.  Her next Dupixent injection is due this Saturday.  She denies any problems or reactions with Dupixent injections.  She feels like Dupixent has almost given her a normal  life.  Seasonal and perennial allergic rhinitis: She reports a little bit of postnasal drip and rhinorrhea at times.  She also has hoarseness.  She denies nasal congestion.  She has seen ENT for her hoarseness and reports that she does breathing exercises for her vocal cords and cough, but does not typically help.  She is currently using Flonase 2 sprays each nostril twice a day, Mucinex 800 mg a day, and Allegra 180 mg once a day.  She reports that Allegra is the only antihistamine where it opens her nose and she can breathe.  She is on Flonase now rather than Nasacort due to insurance not covering it.  She does not feel like Nasacort is what caused her nosebleeds. She feels like it was the Allegra that caused it.  She also does not feel like Mucinex dries her out.  Mucinex keeps her from having nosebleeds.  She also continues to receive allergy injections per protocol.  She feels like the allergy injections have really helped with her watery eyes.  She reports every once a while she will have a small reaction at the injection site that can be itchy.  Discussed making sure that the injection nurse knows of any reactions after allergy injections.  Reflux: She reports that her reflux had been a lot better for the past 2 to 3 weeks, but has picked up now.  She  continues to take Aciphex twice a day.  The Aciphex does keep her from feeling any heartburn.  She reports that she has not lost enough weight for her surgery.  She reports that she has a hernia.  Atopic dermatitis/rash: She reports that every once in a while she will get 1-3 bumps on her back.  She will use the triamcinolone 0.1% ointment and this will help it go away.  She did not ever see dermatology as recommended at her last office visit.  She reports that she had eczema when she was younger and she feels like these bumps are due to eczema.   Drug Allergies:  No Known Allergies  Review of Systems: Negative as per HPI  Physical Exam: BP  112/72   Pulse 96   Temp (!) 97.5 F (36.4 C) (Temporal)   Resp 18   SpO2 99%    Physical Exam Constitutional:      Appearance: Normal appearance.  HENT:     Head: Normocephalic and atraumatic.     Comments: Pharynx normal. Eyes normal. Ears normal. Nose normal    Right Ear: Tympanic membrane, ear canal and external ear normal.     Left Ear: Tympanic membrane, ear canal and external ear normal.     Nose: Nose normal.     Mouth/Throat:     Mouth: Mucous membranes are moist.     Pharynx: Oropharynx is clear.  Eyes:     Conjunctiva/sclera: Conjunctivae normal.  Cardiovascular:     Rate and Rhythm: Regular rhythm.     Heart sounds: Normal heart sounds.  Pulmonary:     Effort: Pulmonary effort is normal.     Breath sounds: Normal breath sounds.     Comments: Lungs clear to auscultation Musculoskeletal:     Cervical back: Neck supple.  Skin:    General: Skin is warm.  Neurological:     Mental Status: She is alert and oriented to person, place, and time.  Psychiatric:        Mood and Affect: Mood normal.        Behavior: Behavior normal.        Thought Content: Thought content normal.        Judgment: Judgment normal.     Diagnostics: Will get at next office visit.   Assessment and Plan: 1. Moderate persistent asthma without complication   2. Seasonal and perennial allergic rhinitis   3. Upper airway cough syndrome   4. Gastroesophageal reflux disease without esophagitis   5. Rash     No orders of the defined types were placed in this encounter.   Patient Instructions  Asthma Trelegy 200, 1 puff daily. Rinse mouth out after Continue montelukast 10 mg once a day to help prevent cough and wheeze Continue Dupixent injections once every 14 days and have access to an epinephrine auto-injector set May use Ventolin 2 puffs every 4 hours as needed for cough, wheeze, tightness in chest, or shortness of breath For asthma flare, begin Flovent 110-2 puffs twice a day (in  addition to your Trelegy) with a spacer for 2 weeks or until cough and wheeze free.   Asthma control goals:  Full participation in all desired activities (may need albuterol before activity) Albuterol use two time or less a week on average (not counting use with activity) Cough interfering with sleep two time or less a month Oral steroids no more than once a year No hospitalizations  Seasonal and perennial allergic rhinitis with upper airway cough  syndrome Continue allergy injections per protocol. Continue to carry your epinephrine autoinjector. Continue Allegra 180 mg once a day as needed for runny nose Decrease Flonase (fluticasone)  1 spray in each nostril twice a day as needed for stuffy nose. It you start having nose bleeds stop using Nasacort Continue saline spray one to two times a day for drainage. Use this before any medicated nasal sprays  Continue biotene mouth wash for dry mouth Recommend decreasing Mucinex if mouth dis dry  Reflux Continue dietary and lifestyle modifications as listed below Continue current regimen as per GI Continue to follow up with GI specialist for further evaluation and treatment  Atopic dermatitis/rash Continue a daily moisturizing routine May use triamcinolone 0.1% ointment to red and itchy areas below your face twice a day as needed   Call the clinic if this treatment plan is not working well for you  Follow up in 6 months or sooner if needed.     Return in about 6 months (around 08/29/2023), or if symptoms worsen or fail to improve.    Thank you for the opportunity to care for this patient.  Please do not hesitate to contact me with questions.  Nehemiah Settle, FNP [facility]

## 2023-03-05 ENCOUNTER — Ambulatory Visit (INDEPENDENT_AMBULATORY_CARE_PROVIDER_SITE_OTHER): Payer: BC Managed Care – PPO

## 2023-03-05 DIAGNOSIS — J309 Allergic rhinitis, unspecified: Secondary | ICD-10-CM

## 2023-03-12 ENCOUNTER — Ambulatory Visit: Payer: Self-pay

## 2023-03-12 DIAGNOSIS — J309 Allergic rhinitis, unspecified: Secondary | ICD-10-CM

## 2023-03-19 ENCOUNTER — Ambulatory Visit (INDEPENDENT_AMBULATORY_CARE_PROVIDER_SITE_OTHER): Payer: BC Managed Care – PPO

## 2023-03-19 DIAGNOSIS — J309 Allergic rhinitis, unspecified: Secondary | ICD-10-CM

## 2023-03-20 ENCOUNTER — Other Ambulatory Visit: Payer: Self-pay | Admitting: Internal Medicine

## 2023-03-26 ENCOUNTER — Ambulatory Visit (INDEPENDENT_AMBULATORY_CARE_PROVIDER_SITE_OTHER): Payer: BC Managed Care – PPO

## 2023-03-26 DIAGNOSIS — J309 Allergic rhinitis, unspecified: Secondary | ICD-10-CM | POA: Diagnosis not present

## 2023-04-02 ENCOUNTER — Ambulatory Visit (INDEPENDENT_AMBULATORY_CARE_PROVIDER_SITE_OTHER): Payer: Self-pay

## 2023-04-02 DIAGNOSIS — J309 Allergic rhinitis, unspecified: Secondary | ICD-10-CM

## 2023-04-09 ENCOUNTER — Ambulatory Visit (INDEPENDENT_AMBULATORY_CARE_PROVIDER_SITE_OTHER): Payer: Self-pay

## 2023-04-09 DIAGNOSIS — J309 Allergic rhinitis, unspecified: Secondary | ICD-10-CM

## 2023-04-15 ENCOUNTER — Other Ambulatory Visit: Payer: Self-pay | Admitting: Internal Medicine

## 2023-04-16 ENCOUNTER — Ambulatory Visit (INDEPENDENT_AMBULATORY_CARE_PROVIDER_SITE_OTHER): Payer: Self-pay

## 2023-04-16 DIAGNOSIS — J309 Allergic rhinitis, unspecified: Secondary | ICD-10-CM | POA: Diagnosis not present

## 2023-04-18 ENCOUNTER — Other Ambulatory Visit: Payer: Self-pay | Admitting: Internal Medicine

## 2023-04-24 ENCOUNTER — Ambulatory Visit (INDEPENDENT_AMBULATORY_CARE_PROVIDER_SITE_OTHER): Payer: BC Managed Care – PPO

## 2023-04-24 DIAGNOSIS — J309 Allergic rhinitis, unspecified: Secondary | ICD-10-CM

## 2023-04-30 ENCOUNTER — Ambulatory Visit (INDEPENDENT_AMBULATORY_CARE_PROVIDER_SITE_OTHER): Payer: BC Managed Care – PPO

## 2023-04-30 DIAGNOSIS — J309 Allergic rhinitis, unspecified: Secondary | ICD-10-CM | POA: Diagnosis not present

## 2023-05-04 ENCOUNTER — Other Ambulatory Visit: Payer: Self-pay | Admitting: Internal Medicine

## 2023-05-07 ENCOUNTER — Ambulatory Visit (INDEPENDENT_AMBULATORY_CARE_PROVIDER_SITE_OTHER): Payer: Self-pay

## 2023-05-07 DIAGNOSIS — J309 Allergic rhinitis, unspecified: Secondary | ICD-10-CM

## 2023-05-07 NOTE — Progress Notes (Signed)
VIALS EXP 05-06-24

## 2023-05-08 DIAGNOSIS — J3081 Allergic rhinitis due to animal (cat) (dog) hair and dander: Secondary | ICD-10-CM | POA: Diagnosis not present

## 2023-05-14 ENCOUNTER — Ambulatory Visit (INDEPENDENT_AMBULATORY_CARE_PROVIDER_SITE_OTHER): Payer: Self-pay

## 2023-05-14 DIAGNOSIS — J309 Allergic rhinitis, unspecified: Secondary | ICD-10-CM

## 2023-05-28 ENCOUNTER — Ambulatory Visit (INDEPENDENT_AMBULATORY_CARE_PROVIDER_SITE_OTHER): Payer: Self-pay

## 2023-05-28 DIAGNOSIS — J309 Allergic rhinitis, unspecified: Secondary | ICD-10-CM

## 2023-06-04 ENCOUNTER — Ambulatory Visit (INDEPENDENT_AMBULATORY_CARE_PROVIDER_SITE_OTHER): Payer: BC Managed Care – PPO

## 2023-06-04 DIAGNOSIS — J309 Allergic rhinitis, unspecified: Secondary | ICD-10-CM

## 2023-06-10 ENCOUNTER — Telehealth: Payer: Self-pay

## 2023-06-10 NOTE — Telephone Encounter (Signed)
Verbal order to fill fluticasone nasal spray 2 sprays per nostril once daily with 2 refills. Per Nehemiah Settle, FNP Spoke to Tresa Endo and sent to [facility] pharmacy.

## 2023-06-26 ENCOUNTER — Ambulatory Visit (INDEPENDENT_AMBULATORY_CARE_PROVIDER_SITE_OTHER): Payer: Self-pay

## 2023-06-26 DIAGNOSIS — J309 Allergic rhinitis, unspecified: Secondary | ICD-10-CM

## 2023-07-09 ENCOUNTER — Ambulatory Visit (INDEPENDENT_AMBULATORY_CARE_PROVIDER_SITE_OTHER): Payer: Self-pay

## 2023-07-09 DIAGNOSIS — J309 Allergic rhinitis, unspecified: Secondary | ICD-10-CM | POA: Diagnosis not present

## 2023-07-21 ENCOUNTER — Other Ambulatory Visit: Payer: Self-pay | Admitting: Internal Medicine

## 2023-07-25 ENCOUNTER — Ambulatory Visit (INDEPENDENT_AMBULATORY_CARE_PROVIDER_SITE_OTHER): Payer: BC Managed Care – PPO

## 2023-07-25 DIAGNOSIS — J309 Allergic rhinitis, unspecified: Secondary | ICD-10-CM

## 2023-08-01 ENCOUNTER — Ambulatory Visit (INDEPENDENT_AMBULATORY_CARE_PROVIDER_SITE_OTHER): Payer: BC Managed Care – PPO

## 2023-08-01 DIAGNOSIS — J309 Allergic rhinitis, unspecified: Secondary | ICD-10-CM | POA: Diagnosis not present

## 2023-08-29 ENCOUNTER — Ambulatory Visit (INDEPENDENT_AMBULATORY_CARE_PROVIDER_SITE_OTHER): Payer: BC Managed Care – PPO

## 2023-08-29 DIAGNOSIS — J309 Allergic rhinitis, unspecified: Secondary | ICD-10-CM | POA: Diagnosis not present

## 2023-08-30 ENCOUNTER — Other Ambulatory Visit: Payer: Self-pay | Admitting: Family

## 2023-09-26 ENCOUNTER — Ambulatory Visit (INDEPENDENT_AMBULATORY_CARE_PROVIDER_SITE_OTHER)

## 2023-09-26 DIAGNOSIS — J309 Allergic rhinitis, unspecified: Secondary | ICD-10-CM

## 2023-10-14 ENCOUNTER — Other Ambulatory Visit: Payer: Self-pay

## 2023-10-14 ENCOUNTER — Other Ambulatory Visit: Payer: Self-pay | Admitting: Internal Medicine

## 2023-10-14 MED ORDER — TRELEGY ELLIPTA 200-62.5-25 MCG/ACT IN AEPB: 1.0000 | INHALATION_SPRAY | Freq: Every day | RESPIRATORY_TRACT | 0 refills | Status: DC

## 2023-10-21 ENCOUNTER — Telehealth: Payer: Self-pay | Admitting: Family

## 2023-10-21 ENCOUNTER — Other Ambulatory Visit: Payer: Self-pay

## 2023-10-21 NOTE — Telephone Encounter (Signed)
 Called pt was able to schedule OV for Friday, to get her refills sent in.

## 2023-10-21 NOTE — Telephone Encounter (Signed)
 Pharmacy request refill for trelogy

## 2023-10-23 ENCOUNTER — Ambulatory Visit (INDEPENDENT_AMBULATORY_CARE_PROVIDER_SITE_OTHER): Payer: Self-pay

## 2023-10-23 DIAGNOSIS — J309 Allergic rhinitis, unspecified: Secondary | ICD-10-CM | POA: Diagnosis not present

## 2023-10-24 NOTE — Patient Instructions (Incomplete)
 Asthma Trelegy 200, 1 puff daily. Rinse mouth out after Continue montelukast 10 mg once a day to help prevent cough and wheeze Continue Dupixent injections once every 14 days  May use Ventolin 2 puffs every 4 hours as needed for cough, wheeze, tightness in chest, or shortness of breath For asthma flare, begin Flovent 110-2 puffs twice a day (in addition to your Trelegy) with a spacer for 2 weeks or until cough and wheeze free.   Asthma control goals:  Full participation in all desired activities (may need albuterol before activity) Albuterol use two time or less a week on average (not counting use with activity) Cough interfering with sleep two time or less a month Oral steroids no more than once a year No hospitalizations  Seasonal and perennial allergic rhinitis with upper airway cough syndrome Continue allergy injections per protocol. Continue to carry your epinephrine autoinjector. Continue Allegra 180 mg once a day as needed for runny nose  Flonase (fluticasone)  1 spray in each nostril twice a day as needed for stuffy nose.  Continue saline spray one to two times a day for drainage. Use this before any medicated nasal sprays  Recommend decreasing Mucinex if mouth dis dry  Reflux Continue dietary and lifestyle modifications as listed below Continue current regimen as per GI Continue to follow up with GI specialist for further evaluation and treatment  Atopic dermatitis/rash Continue a daily moisturizing routine May use triamcinolone 0.1% ointment to red and itchy areas below your face twice a day as needed   Call the clinic if this treatment plan is not working well for you  Follow up in months or sooner if needed.

## 2023-10-25 ENCOUNTER — Ambulatory Visit: Admitting: Family

## 2023-10-25 ENCOUNTER — Encounter: Payer: Self-pay | Admitting: Family

## 2023-10-25 VITALS — BP 130/78 | HR 104 | Temp 98.0°F | Resp 22 | Ht 63.0 in | Wt 208.0 lb

## 2023-10-25 DIAGNOSIS — K219 Gastro-esophageal reflux disease without esophagitis: Secondary | ICD-10-CM | POA: Diagnosis not present

## 2023-10-25 DIAGNOSIS — R21 Rash and other nonspecific skin eruption: Secondary | ICD-10-CM

## 2023-10-25 DIAGNOSIS — J302 Other seasonal allergic rhinitis: Secondary | ICD-10-CM

## 2023-10-25 DIAGNOSIS — J3089 Other allergic rhinitis: Secondary | ICD-10-CM | POA: Diagnosis not present

## 2023-10-25 DIAGNOSIS — R058 Other specified cough: Secondary | ICD-10-CM | POA: Diagnosis not present

## 2023-10-25 DIAGNOSIS — J4540 Moderate persistent asthma, uncomplicated: Secondary | ICD-10-CM | POA: Diagnosis not present

## 2023-10-25 MED ORDER — TRIAMCINOLONE ACETONIDE 0.1 % EX OINT
TOPICAL_OINTMENT | CUTANEOUS | 1 refills | Status: AC
Start: 2023-10-25 — End: ?

## 2023-10-25 MED ORDER — FLUTICASONE PROPIONATE HFA 110 MCG/ACT IN AERO: INHALATION_SPRAY | RESPIRATORY_TRACT | 2 refills | Status: AC

## 2023-10-25 MED ORDER — ALBUTEROL SULFATE HFA 108 (90 BASE) MCG/ACT IN AERS: 2.0000 | INHALATION_SPRAY | RESPIRATORY_TRACT | 0 refills | Status: AC | PRN

## 2023-10-25 MED ORDER — TRELEGY ELLIPTA 200-62.5-25 MCG/ACT IN AEPB: 1.0000 | INHALATION_SPRAY | Freq: Every day | RESPIRATORY_TRACT | 0 refills | Status: DC

## 2023-10-25 MED ORDER — MONTELUKAST SODIUM 10 MG PO TABS: 10.0000 mg | ORAL_TABLET | Freq: Every day | ORAL | 1 refills | Status: DC

## 2023-10-25 NOTE — Progress Notes (Signed)
 [address] [place] [address] Dept: [phone]  FOLLOW UP NOTE  Patient ID: Bridget Bell, female    DOB: 11-22-1960  Age: 63 y.o. MRN: [id number] Date of Office Visit: 10/25/2023  Assessment  Chief Complaint: Medication Refill and Follow-up (Medication refill)  HPI Bridget Bell is a 63 year old female who presents today for follow-up.  She was last seen on February 28, 2023 by myself for moderate persistent asthma without complication, seasonal and perennial allergic rhinitis, upper airway cough syndrome, gastroesophageal reflux disease, and rash.  She denies any new diagnoses or surgeries since her last office visit.  Asthma: She is currently taking Trelegy 200 mcg 1 puff daily, montelukast 10 mg daily, Dupixent every 14 days, and albuterol as needed.  She denies any problems or reactions with her Dupixent injections.  She reports that due to the pollen she has been using her albuterol 2 puffs in the morning before using the Trelegy.  If she does not do this she will have more coughing or wheezing.  She mentions that she has Flovent to use for flares, but they are all expired.  She expresses concerns about adding on another steroid inhaler during flares.  Otherwise she denies tightness in chest, shortness of breath, and nocturnal awakenings due to breathing problems.  Since her last office visit she has not required any systemic steroids or made any trips to the emergency room or urgent care due to breathing problems.  Seasonal and perennial allergic rhinitis: She is currently taking Allegra 180 mg once a day, Mucinex once a day, Nasacort 2 sprays each nostril once a day, and allergy injections per protocol.  She denies any problems with her allergy injections, but mentions they can be itchy. She does feel like her allergy injections help. She reports nasal congestion and postnasal drip always.  She denies rhinorrhea.  She has not been treated for any sinus infections since we last saw her.   She mentions that her primary care physician recommended that she stop Mucinex due to it being drying. She has been on Mucinex for years. She mentions if she does not take Mucinex along with the Allegra she will have a nosebleed.  Reflux: She continues to take Aciphex twice a day as recommended by GI.  She mentions that they want her to do gastric bypass, but she is not interested.  Rash: She reports that she will break out into red bumps that pop up where her underwear line is, her bra line, or her pants line at times.  These areas itchy, and if she scratches it burns.  She will use triamcinolone 0.1% ointment not that often and it does help dry them up.  She reports that this looks different from the eczema she had when she was younger.   Drug Allergies:  No Known Allergies  Review of Systems: Negative except as per HPI   Physical Exam: BP 130/78   Pulse (!) 104   Temp 98 F (36.7 C) (Temporal)   Resp (!) 22   Ht 5\' 3"  (1.6 m)   Wt 208 lb (94.3 kg)   SpO2 99%   BMI 36.85 kg/m    Physical Exam Constitutional:      Appearance: Normal appearance.  HENT:     Head: Normocephalic and atraumatic.     Comments: Pharynx normal, eyes normal, ears normal, nose: Bilateral lower turbinates mildly edematous with no drainage noted    Right Ear: Tympanic membrane, ear canal and external ear normal.  Left Ear: Tympanic membrane, ear canal and external ear normal.     Mouth/Throat:     Mouth: Mucous membranes are moist.     Pharynx: Oropharynx is clear.  Eyes:     Conjunctiva/sclera: Conjunctivae normal.  Cardiovascular:     Rate and Rhythm: Regular rhythm.     Heart sounds: Normal heart sounds.  Pulmonary:     Effort: Pulmonary effort is normal.     Breath sounds: Normal breath sounds.     Comments: Lungs clear to auscultation Musculoskeletal:     Cervical back: Neck supple.  Skin:    General: Skin is warm.  Neurological:     Mental Status: She is alert and oriented to person,  place, and time.  Psychiatric:        Mood and Affect: Mood normal.        Behavior: Behavior normal.        Thought Content: Thought content normal.        Judgment: Judgment normal.     Diagnostics: FVC 2.68 L (92%), FEV1 2.29 L (101%), FEV1/FVC 0.85.  Predicted FVC 2.90 L, predicted FEV1 2.27 L.  Spirometry indicates normal respiratory function.  Assessment and Plan: 1. Seasonal and perennial allergic rhinitis   2. Not well controlled moderate persistent asthma   3. Gastroesophageal reflux disease without esophagitis   4. Upper airway cough syndrome   5. Rash     Meds ordered this encounter  Medications   Fluticasone-Umeclidin-Vilant (TRELEGY ELLIPTA) 200-62.5-25 MCG/ACT AEPB    Sig: Inhale 1 puff into the lungs daily.    Dispense:  180 each    Refill:  0   montelukast (SINGULAIR) 10 MG tablet    Sig: Take 1 tablet (10 mg total) by mouth at bedtime.    Dispense:  90 tablet    Refill:  1   albuterol (VENTOLIN HFA) 108 (90 Base) MCG/ACT inhaler    Sig: Inhale 2 puffs into the lungs every 4 (four) hours as needed for wheezing or shortness of breath.    Dispense:  3 each    Refill:  0    Please dispense 90 day supply   triamcinolone ointment (KENALOG) 0.1 %    Sig: Use 1 application twice a day as needed to red itchy areas.  Do not use on face, neck, groin, or armpit region. Do not use longer than 7 days in a row.    Dispense:  30 g    Refill:  1   fluticasone (FLOVENT HFA) 110 MCG/ACT inhaler    Sig: During asthma flare begin Flovent 110 mcg taking 2 puffs twice a day with spacer for 1-2 weeks    Dispense:  1 each    Refill:  2    Patient Instructions  Asthma Trelegy 200, 1 puff daily. Rinse mouth out after Continue montelukast 10 mg once a day to help prevent cough and wheeze Continue Dupixent injections once every 14 days  May use Ventolin 2 puffs every 4 hours as needed for cough, wheeze, tightness in chest, or shortness of breath For asthma flare and NOW begin  Flovent (fluticasone) 110 mcg-2 puffs twice a day (in addition to your Trelegy) with a spacer for 2 weeks or until cough and wheeze free.   Asthma control goals:  Full participation in all desired activities (may need albuterol before activity) Albuterol use two time or less a week on average (not counting use with activity) Cough interfering with sleep two time or less a  month Oral steroids no more than once a year No hospitalizations  Seasonal and perennial allergic rhinitis with upper airway cough syndrome Continue allergy injections per protocol. Continue to carry your epinephrine autoinjector. Continue Allegra 180 mg once a day as needed for runny nose  Flonase (fluticasone)  1 spray in each nostril twice a day as needed for stuffy nose.  Continue saline spray one to two times a day for drainage. Use this before any medicated nasal sprays  Recommend decreasing Mucinex   Reflux Continue dietary and lifestyle modifications as listed below Continue current regimen as per GI Continue to follow up with GI specialist for further evaluation and treatment  Atopic dermatitis/rash Continue a daily moisturizing routine May use triamcinolone 0.1% ointment to red and itchy areas below your face twice a day as needed   Call the clinic if this treatment plan is not working well for you  Follow up in 6 months or sooner if needed.    Return in about 6 months (around 04/25/2024).    Thank you for the opportunity to care for this patient.  Please do not hesitate to contact me with questions.  Nehemiah Settle, FNP [facility]

## 2023-10-31 ENCOUNTER — Telehealth: Payer: Self-pay | Admitting: Family

## 2023-10-31 MED ORDER — ASMANEX HFA 100 MCG/ACT IN AERO: 2.0000 | INHALATION_SPRAY | Freq: Two times a day (BID) | RESPIRATORY_TRACT | 5 refills | Status: AC

## 2023-10-31 NOTE — Telephone Encounter (Signed)
 Pt request a call back about her flovent.

## 2023-10-31 NOTE — Telephone Encounter (Signed)
 Please change Flovent 110 mcg to Asmanex HFA 100 mcg.For asthma flare  begin Asmanex HFA 100 mcg-2 puffs twice a day (in addition to your Trelegy) with a spacer for 1-2 weeks

## 2023-10-31 NOTE — Telephone Encounter (Signed)
 Pt informed of the rx change- sent to [facility] as requested.

## 2023-11-21 ENCOUNTER — Ambulatory Visit (INDEPENDENT_AMBULATORY_CARE_PROVIDER_SITE_OTHER): Payer: Self-pay

## 2023-11-21 DIAGNOSIS — J309 Allergic rhinitis, unspecified: Secondary | ICD-10-CM

## 2023-12-09 DIAGNOSIS — J302 Other seasonal allergic rhinitis: Secondary | ICD-10-CM | POA: Diagnosis not present

## 2023-12-09 NOTE — Progress Notes (Signed)
 VIALS MADE 12-09-23

## 2023-12-10 DIAGNOSIS — J3081 Allergic rhinitis due to animal (cat) (dog) hair and dander: Secondary | ICD-10-CM | POA: Diagnosis not present

## 2023-12-19 ENCOUNTER — Ambulatory Visit (INDEPENDENT_AMBULATORY_CARE_PROVIDER_SITE_OTHER)

## 2023-12-19 DIAGNOSIS — J309 Allergic rhinitis, unspecified: Secondary | ICD-10-CM | POA: Diagnosis not present

## 2023-12-25 ENCOUNTER — Other Ambulatory Visit: Payer: Self-pay | Admitting: *Deleted

## 2023-12-25 MED ORDER — DUPIXENT 300 MG/2ML ~~LOC~~ SOSY: PREFILLED_SYRINGE | SUBCUTANEOUS | 3 refills | Status: AC

## 2024-01-14 ENCOUNTER — Ambulatory Visit (INDEPENDENT_AMBULATORY_CARE_PROVIDER_SITE_OTHER)

## 2024-01-14 DIAGNOSIS — J309 Allergic rhinitis, unspecified: Secondary | ICD-10-CM

## 2024-01-21 ENCOUNTER — Other Ambulatory Visit: Payer: Self-pay | Admitting: Family

## 2024-01-21 NOTE — Telephone Encounter (Signed)
 Refill for Trelegy 200 mcg x 90 day supply with 0 refills sent to [facility].

## 2024-01-22 ENCOUNTER — Ambulatory Visit (INDEPENDENT_AMBULATORY_CARE_PROVIDER_SITE_OTHER): Payer: Self-pay

## 2024-01-22 DIAGNOSIS — J309 Allergic rhinitis, unspecified: Secondary | ICD-10-CM | POA: Diagnosis not present

## 2024-01-28 ENCOUNTER — Ambulatory Visit (INDEPENDENT_AMBULATORY_CARE_PROVIDER_SITE_OTHER): Payer: Self-pay

## 2024-01-28 DIAGNOSIS — J309 Allergic rhinitis, unspecified: Secondary | ICD-10-CM | POA: Diagnosis not present

## 2024-02-21 ENCOUNTER — Other Ambulatory Visit: Payer: Self-pay | Admitting: Medical Genetics

## 2024-02-25 ENCOUNTER — Other Ambulatory Visit: Payer: Self-pay

## 2024-02-25 ENCOUNTER — Ambulatory Visit (INDEPENDENT_AMBULATORY_CARE_PROVIDER_SITE_OTHER): Payer: Self-pay

## 2024-02-25 DIAGNOSIS — J309 Allergic rhinitis, unspecified: Secondary | ICD-10-CM | POA: Diagnosis not present

## 2024-02-25 MED ORDER — EPINEPHRINE 0.3 MG/0.3ML IJ SOAJ: INTRAMUSCULAR | 2 refills | Status: AC

## 2024-02-25 NOTE — Telephone Encounter (Signed)
 Refill for EpiPen  0.3 mg x 1 with 2 refill sent to [facility].

## 2024-03-03 ENCOUNTER — Telehealth: Payer: Self-pay

## 2024-03-03 NOTE — Telephone Encounter (Signed)
 Mervyn calling from [facility] to verify appointment Dates 10/23/2023 and 11/21/2023 for immunotherapy patient was covered under different insurance at that time and will resubmit claims

## 2024-03-26 ENCOUNTER — Ambulatory Visit (INDEPENDENT_AMBULATORY_CARE_PROVIDER_SITE_OTHER): Payer: Self-pay

## 2024-03-26 DIAGNOSIS — J309 Allergic rhinitis, unspecified: Secondary | ICD-10-CM

## 2024-04-21 ENCOUNTER — Ambulatory Visit (INDEPENDENT_AMBULATORY_CARE_PROVIDER_SITE_OTHER)

## 2024-04-21 DIAGNOSIS — J309 Allergic rhinitis, unspecified: Secondary | ICD-10-CM | POA: Diagnosis not present

## 2024-04-22 ENCOUNTER — Other Ambulatory Visit: Payer: Self-pay | Admitting: Family

## 2024-04-27 ENCOUNTER — Ambulatory Visit: Admitting: Family

## 2024-05-03 NOTE — Patient Instructions (Incomplete)
 Asthma Trelegy 200, 1 puff daily. Rinse mouth out after Continue montelukast  10 mg once a day to help prevent cough and wheeze Continue Dupixent  injections once every 14 days  May use Ventolin  2 puffs every 4 hours as needed for cough, wheeze, tightness in chest, or shortness of breath For asthma flare begin Asmanex  HFA 100 mcg-2 puffs twice a day (in addition to your Trelegy) with a spacer for 2 weeks or until cough and wheeze free.   Asthma control goals:  Full participation in all desired activities (may need albuterol  before activity) Albuterol  use two time or less a week on average (not counting use with activity) Cough interfering with sleep two time or less a month Oral steroids no more than once a year No hospitalizations  Seasonal and perennial allergic rhinitis with upper airway cough syndrome Continue allergy injections per protocol. Continue to carry your epinephrine  autoinjector. Continue Allegra 180 mg once a day as needed for runny nose  Flonase  (fluticasone )  1 spray in each nostril twice a day as needed for stuffy nose.  Continue saline spray one to two times a day for drainage. Use this before any medicated nasal sprays  Recommend decreasing Mucinex   Reflux Continue dietary and lifestyle modifications as listed below Continue current regimen as per GI Continue to follow up with GI specialist for further evaluation and treatment  Atopic dermatitis/rash Continue a daily moisturizing routine May use triamcinolone  0.1% ointment to red and itchy areas below your face twice a day as needed   Call the clinic if this treatment plan is not working well for you  Follow up in  months or sooner if needed.

## 2024-05-04 ENCOUNTER — Encounter: Payer: Self-pay | Admitting: Family

## 2024-05-04 ENCOUNTER — Ambulatory Visit: Admitting: Family

## 2024-05-04 VITALS — BP 128/78 | HR 87 | Temp 97.9°F | Resp 18 | Ht 63.0 in | Wt 215.5 lb

## 2024-05-04 DIAGNOSIS — Z23 Encounter for immunization: Secondary | ICD-10-CM

## 2024-05-04 DIAGNOSIS — K219 Gastro-esophageal reflux disease without esophagitis: Secondary | ICD-10-CM | POA: Diagnosis not present

## 2024-05-04 DIAGNOSIS — L308 Other specified dermatitis: Secondary | ICD-10-CM

## 2024-05-04 DIAGNOSIS — J3089 Other allergic rhinitis: Secondary | ICD-10-CM

## 2024-05-04 DIAGNOSIS — J4540 Moderate persistent asthma, uncomplicated: Secondary | ICD-10-CM

## 2024-05-04 DIAGNOSIS — J302 Other seasonal allergic rhinitis: Secondary | ICD-10-CM

## 2024-05-04 MED ORDER — MONTELUKAST SODIUM 10 MG PO TABS: 10.0000 mg | ORAL_TABLET | Freq: Every day | ORAL | 1 refills | Status: AC

## 2024-05-04 NOTE — Progress Notes (Unsigned)
 [address] [place] [address] Dept: [phone]  FOLLOW UP NOTE  Patient ID: Bridget Bell, female    DOB: 09-02-1960  Age: 63 y.o. MRN: [id number] Date of Office Visit: 05/04/2024  Assessment  Chief Complaint: Follow-up (Doing  well for the most part, little congestion in the chest, since August. )  HPI Bridget Bell is a 63 year old female who presents today for follow-up of seasonal and perennial allergic rhinitis, not well-controlled moderate persistent asthma, gastroesophageal reflux disease, upper airway cough syndrome, and rash.  She was last seen on October 25, 2023 by myself.  She denies any new diagnosis or surgery since her last office visit.  Asthma: She continues to take Trelegy 200 mcg 1 puff once a day, Singulair  10 mg daily, and Dupixent  injections every 14 days at home.  She also has Asmanex  to use during asthma flares.  She reports that she has been using Asmanex  rather than her albuterol  for acute symptoms.  Discussed the difference between Asmanex  and albuterol .  She does feel like Dupixent  has really built her immune system up and has most definitely helped her asthma..  She has not needed an antibiotic in a long time.  She reports coughing all the time due to acid reflux.  She also has a little bit of wheezing when coughing, but this does not occur every day and comes and goes.  She also has a little bit of tightness in her chest when wheezing.  She denies shortness of breath.  She does report her cough does occur at night.  She has not made any trips to the emergency room or urgent care due to breathing problems and has not required any systemic steroids.  She has not used her albuterol  since her last office visit, because she thought Asmanex  was her rescue inhaler.  Seasonal and perennial allergic rhinitis: She reports rhinorrhea that is clear and sometimes yellow.  She also has light nasal congestion and she always has postnasal drip.  She has not been treated for any  sinus infections since we last saw her.  She continues to take Allegra 180 mg once a day and Flonase  2 sprays each nostril once a day.  She is no longer taking Mucinex.  She also continues to receive allergy injections per protocol.  She reports that she will have 1 arm that always swells.  She reports that she lets the injection room nurse know.  After looking through epic she has not not instructed our shot room nurse of any large local reactions since April 2025.  She does feel like her allergy injections have helped.  She reports in the past she has tried stopping and developed nasal congestion and watery eyes.  Reflux: She continues to take Aciphex twice a day as per GI and her primary care physician put her on sucralfate 1 g 4 times a day to help coat her stomach.  She reports that her GI doctor wants her to lose 30 pounds before they would do any surgery.  She denies any heartburn, but mentions that when she is off Aciphex she will have heartburn.  At times she will have acidic taste and will sometimes burp it up.  Atopic dermatitis/rash.  She reports every once a while she will get a few itchy bumps that pop up along her bra line/panty line and waistline.  She will use triamcinolone  0.1% ointment for 3 days and it will work right.   Drug Allergies:  No Known Allergies  Review of Systems: Negative except as per HPI   Physical Exam: BP 128/78 (BP Location: Right Arm, Patient Position: Sitting, Cuff Size: Normal)   Pulse 87   Temp 97.9 F (36.6 C) (Temporal)   Resp 18   Ht 5' 3 (1.6 m)   Wt 215 lb 8 oz (97.8 kg)   SpO2 100%   BMI 38.17 kg/m    Physical Exam Constitutional:      Appearance: Normal appearance.  HENT:     Head: Normocephalic and atraumatic.     Right Ear: Tympanic membrane, ear canal and external ear normal.     Left Ear: Tympanic membrane, ear canal and external ear normal.     Nose: Nose normal.     Mouth/Throat:     Mouth: Mucous membranes are moist.      Pharynx: Oropharynx is clear.  Eyes:     Conjunctiva/sclera: Conjunctivae normal.  Cardiovascular:     Rate and Rhythm: Regular rhythm.     Heart sounds: Normal heart sounds.  Pulmonary:     Effort: Pulmonary effort is normal.     Breath sounds: Normal breath sounds.     Comments: Lungs clear to auscultation Musculoskeletal:     Cervical back: Neck supple.  Skin:    General: Skin is warm.  Neurological:     Mental Status: She is alert and oriented to person, place, and time.  Psychiatric:        Mood and Affect: Mood normal.        Behavior: Behavior normal.        Thought Content: Thought content normal.        Judgment: Judgment normal.     Diagnostics: FVC 3.07 L (106%), FEV1 2.55 L (112%), FEV1/FVC 0.83.  Spirometry indicates normal spirometry.  Assessment and Plan: 1. Seasonal and perennial allergic rhinitis   2. Moderate persistent asthma without complication   3. Gastroesophageal reflux disease without esophagitis   4. Other eczema     No orders of the defined types were placed in this encounter.   Patient Instructions  Asthma ContinueTrelegy 200, 1 puff daily. Rinse mouth out after Continue montelukast  10 mg once a day to help prevent cough and wheeze Continue Dupixent  injections once every 14 days at home May use Ventolin  2 puffs every 4 hours as needed for cough, wheeze, tightness in chest, or shortness of breath For asthma flare begin Asmanex  HFA 100 mcg-2 puffs twice a day (in addition to your Trelegy) with a spacer for 2 weeks or until cough and wheeze free.  Stop using Asmanex  HFA as your rescue inhaler  Asthma control goals:  Full participation in all desired activities (may need albuterol  before activity) Albuterol  use two time or less a week on average (not counting use with activity) Cough interfering with sleep two time or less a month Oral steroids no more than once a year No hospitalizations  Seasonal and perennial allergic rhinitis with upper  airway cough syndrome Continue allergy injections per protocol. Continue to carry your epinephrine  autoinjector. Please let the injection room nurse know if you are having large local reactions at the injection site Continue Allegra 180 mg once a day as needed for runny nose  Flonase  (fluticasone )  1 spray in each nostril twice a day as needed for stuffy nose.  Continue saline spray one to two times a day for drainage. Use this before any medicated nasal sprays  Recommend decreasing Mucinex   Reflux Continue dietary and lifestyle modifications  as listed below Continue current regimen as per GI Continue to follow up with GI specialist for further evaluation and treatment  Atopic dermatitis/rash Continue a daily moisturizing routine May use triamcinolone  0.1% ointment to red and itchy areas below your face twice a day as needed   Recommend getting RSV vaccine Influenza vaccine given today Call the clinic if this treatment plan is not working well for you  Follow up in 6  months or sooner if needed.   Return in about 6 months (around 11/02/2024), or if symptoms worsen or fail to improve.    Thank you for the opportunity to care for this patient.  Please do not hesitate to contact me with questions.  Wanda Craze, FNP [facility] 

## 2024-05-20 ENCOUNTER — Ambulatory Visit (INDEPENDENT_AMBULATORY_CARE_PROVIDER_SITE_OTHER): Payer: Self-pay

## 2024-05-20 ENCOUNTER — Other Ambulatory Visit: Payer: Self-pay | Admitting: Medical Genetics

## 2024-05-20 DIAGNOSIS — Z006 Encounter for examination for normal comparison and control in clinical research program: Secondary | ICD-10-CM

## 2024-05-20 DIAGNOSIS — J309 Allergic rhinitis, unspecified: Secondary | ICD-10-CM

## 2024-06-02 DIAGNOSIS — J302 Other seasonal allergic rhinitis: Secondary | ICD-10-CM | POA: Diagnosis not present

## 2024-06-02 DIAGNOSIS — J3089 Other allergic rhinitis: Secondary | ICD-10-CM | POA: Diagnosis not present

## 2024-06-02 DIAGNOSIS — J3081 Allergic rhinitis due to animal (cat) (dog) hair and dander: Secondary | ICD-10-CM | POA: Diagnosis not present

## 2024-06-02 NOTE — Progress Notes (Signed)
 VIALS MADE ON 06/02/24

## 2024-06-16 ENCOUNTER — Telehealth: Payer: Self-pay

## 2024-06-16 ENCOUNTER — Ambulatory Visit (INDEPENDENT_AMBULATORY_CARE_PROVIDER_SITE_OTHER): Payer: Self-pay

## 2024-06-16 DIAGNOSIS — J309 Allergic rhinitis, unspecified: Secondary | ICD-10-CM

## 2024-06-16 NOTE — Progress Notes (Deleted)
 Patient reported dollar size hive and swelling that lasts up to 4 days. Patient takes antihistamine morning of shot but not 1-2 hours before. Patient was advised to take antihistamine a hour or two before shot and to use ice.

## 2024-06-16 NOTE — Telephone Encounter (Signed)
 Patient reported dollar size hive and swelling that lasts up to 4 days. Patient takes antihistamine morning of shot but not 1-2 hours before. Patient was advised to take antihistamine a hour or two before shot and to use ice.

## 2024-06-16 NOTE — Telephone Encounter (Signed)
 Thanks and she can double up on antihistamines too. So 2 tablets a few hours before.

## 2024-06-25 ENCOUNTER — Ambulatory Visit

## 2024-06-25 DIAGNOSIS — J3089 Other allergic rhinitis: Secondary | ICD-10-CM

## 2024-06-25 DIAGNOSIS — J302 Other seasonal allergic rhinitis: Secondary | ICD-10-CM

## 2024-07-10 ENCOUNTER — Other Ambulatory Visit: Payer: Self-pay | Admitting: Family

## 2024-07-21 ENCOUNTER — Ambulatory Visit (INDEPENDENT_AMBULATORY_CARE_PROVIDER_SITE_OTHER)

## 2024-07-21 DIAGNOSIS — J3089 Other allergic rhinitis: Secondary | ICD-10-CM

## 2024-07-21 DIAGNOSIS — J302 Other seasonal allergic rhinitis: Secondary | ICD-10-CM | POA: Diagnosis not present

## 2024-08-19 ENCOUNTER — Ambulatory Visit

## 2024-08-19 DIAGNOSIS — J302 Other seasonal allergic rhinitis: Secondary | ICD-10-CM | POA: Diagnosis not present

## 2024-11-02 ENCOUNTER — Ambulatory Visit: Admitting: Family
